# Patient Record
Sex: Male | Born: 1985 | Race: White | Hispanic: No | Marital: Married | Smoking: Never smoker
Health system: Southern US, Community
[De-identification: ages and names within clinical notes are randomized; demographics above are authoritative.]

## PROBLEM LIST (undated history)

## (undated) DIAGNOSIS — E119 Type 2 diabetes mellitus without complications: Secondary | ICD-10-CM

## (undated) DIAGNOSIS — R112 Nausea with vomiting, unspecified: Secondary | ICD-10-CM

## (undated) DIAGNOSIS — Z9889 Other specified postprocedural states: Secondary | ICD-10-CM

## (undated) DIAGNOSIS — G47419 Narcolepsy without cataplexy: Secondary | ICD-10-CM

## (undated) DIAGNOSIS — F909 Attention-deficit hyperactivity disorder, unspecified type: Secondary | ICD-10-CM

## (undated) HISTORY — PX: KNEE ARTHROSCOPY: SUR90

---

## 2005-08-04 ENCOUNTER — Encounter: Admission: RE | Admit: 2005-08-04 | Discharge: 2005-08-04 | Payer: Self-pay | Admitting: Sports Medicine

## 2010-06-14 ENCOUNTER — Emergency Department (HOSPITAL_COMMUNITY): Admission: EM | Admit: 2010-06-14 | Discharge: 2010-06-14 | Payer: Self-pay | Admitting: Emergency Medicine

## 2016-12-13 ENCOUNTER — Ambulatory Visit (INDEPENDENT_AMBULATORY_CARE_PROVIDER_SITE_OTHER): Payer: BC Managed Care – PPO | Admitting: Family Medicine

## 2016-12-13 ENCOUNTER — Encounter: Payer: Self-pay | Admitting: Family Medicine

## 2016-12-13 DIAGNOSIS — S92341A Displaced fracture of fourth metatarsal bone, right foot, initial encounter for closed fracture: Secondary | ICD-10-CM | POA: Diagnosis not present

## 2016-12-13 NOTE — Assessment & Plan Note (Signed)
icing, elevation, tylenol or motrin if needed.  Supportive shoe (offered cam walker, postop shoe but discussed not necessary except if pain in supportive shoe).  No running, cycling, swimming, lower body workouts for next 2 weeks.  Will reevaluate at that time.  Expect 6 weeks to completely heal and return to running.

## 2016-12-13 NOTE — Patient Instructions (Signed)
You have a 4th metatarsal shaft fracture. These typically take 6 weeks to heal. Ice the area 15 minutes at a time 3-4 times a day. Elevate above your heart when possible to help with swelling. Wear supportive shoe at all times when up and walking around. Cam walker, postop shoe are options for comfort only if needed. Follow up with me in 2 weeks for reevaluation. We will discuss possibility of cross training with cycling, swimming at that time. No running until 6 weeks out though.

## 2016-12-13 NOTE — Progress Notes (Signed)
PCP: Dr. Leodis SiasFrancis Wong  Subjective:   HPI: Patient is a 31 y.o. male here for right foot injury.  Patient is a runner. [place] Running 26-30 miles a week and longest run alternates between 10 and 13 miles. States on 1/27 he was at mile 8 when he stepped on something and felt/heard a pop right foot. Kept running after this - afterwards pain was bad. Associated with some swelling and bruising. Pain level 6/10, sharp dorsal to plantar right foot. No prior injuries. No skin changes, numbness.  No past medical history on file.  No current outpatient prescriptions on file prior to visit.   No current facility-administered medications on file prior to visit.     No past surgical history on file.  No Known Allergies  Social History   Social History  . Marital status: Single    Spouse name: N/A  . Number of children: N/A  . Years of education: N/A   Occupational History  . Not on file.   Social History Main Topics  . Smoking status: Never Smoker  . Smokeless tobacco: Never Used  . Alcohol use Not on file  . Drug use: Unknown  . Sexual activity: Not on file   Other Topics Concern  . Not on file   Social History Narrative  . No narrative on file    No family history on file.  BP 133/90   Pulse 69   Ht 5\' 8"  (1.727 m)   Wt 190 lb (86.2 kg)   BMI 28.89 kg/m   Review of Systems: See HPI above.     Objective:  Physical Exam:  Gen: NAD, comfortable in exam room  Right foot/ankle: Mild dorsal foot pain.  Mild bruising into 2nd-4th toes. FROM ankle without pain. TTP over mid 4th metatarsal.  No other tenderness about foot or ankle. Negative ant drawer and talar tilt.   Negative syndesmotic compression. Negative metatarsal squeeze. Thompsons test negative. NV intact distally.  Left foot/ankle: FROM without pain.   MSK u/s right foot: Minimally displaced 4th metatarsal shaft fracture noted with edema, mild  neovascularity.  No other abnormalities.  Assessment & Plan:  1. Right 4th metatarsal fracture - icing, elevation, tylenol or motrin if needed.  Supportive shoe (offered cam walker, postop shoe but discussed not necessary except if pain in supportive shoe).  No running, cycling, swimming, lower body workouts for next 2 weeks.  Will reevaluate at that time.  Expect 6 weeks to completely heal and return to running.

## 2016-12-27 ENCOUNTER — Encounter: Payer: Self-pay | Admitting: Family Medicine

## 2016-12-27 ENCOUNTER — Ambulatory Visit (INDEPENDENT_AMBULATORY_CARE_PROVIDER_SITE_OTHER): Payer: BC Managed Care – PPO | Admitting: Family Medicine

## 2016-12-27 DIAGNOSIS — S92341D Displaced fracture of fourth metatarsal bone, right foot, subsequent encounter for fracture with routine healing: Secondary | ICD-10-CM

## 2016-12-27 NOTE — Patient Instructions (Signed)
After a week as long as pain stays less than 3 on a scale of 1-10 you can start swimming (no push off the wall though), low resistance cycling. Follow up with me in 2 weeks. Icing, tylenol if needed. No running, jumping, elliptical, rowing yet.

## 2016-12-28 NOTE — Progress Notes (Signed)
PCP: Dr. Leodis SiasFrancis Wong  Subjective:   HPI: Patient is a 31 y.o. male here for right foot injury.  1/30: Patient is a runner. [place] Running 26-30 miles a week and longest run alternates between 10 and 13 miles. States on 1/27 he was at mile 8 when he stepped on something and felt/heard a pop right foot. Kept running after this - afterwards pain was bad. Associated with some swelling and bruising. Pain level 6/10, sharp dorsal to plantar right foot. No prior injuries. No skin changes, numbness.  2/13: Patient reports he's doing well. Pain level is 0/10 at rest, up to 4/10 at worst dorsal foot when changing terrain. Swelling and bruising have improved. Was worse on Thursday when on feet more. Feels more stiff in morning, improves through the day. No skin changes, numbness.  No past medical history on file.  Current Outpatient Prescriptions on File Prior to Visit  Medication Sig Dispense Refill  . amphetamine-dextroamphetamine (ADDERALL) 5 MG tablet      No current facility-administered medications on file prior to visit.     No past surgical history on file.  No Known Allergies  Social History   Social History  . Marital status: Single    Spouse name: N/A  . Number of children: N/A  . Years of education: N/A   Occupational History  . Not on file.   Social History Main Topics  . Smoking status: Never Smoker  . Smokeless tobacco: Never Used  . Alcohol use Not on file  . Drug use: Unknown  . Sexual activity: Not on file   Other Topics Concern  . Not on file   Social History Narrative  . No narrative on file    No family history on file.  BP 132/83   Pulse 82   Ht 5\' 9"  (1.753 m)   Wt 195 lb (88.5 kg)   BMI 28.80 kg/m   Review of Systems: See HPI above.     Objective:  Physical Exam:  Gen: NAD, comfortable in exam room  Right foot/ankle: No bruising, swelling. FROM ankle without pain. Mild  TTP over mid 4th metatarsal.  No other tenderness about foot or ankle. Negative ant drawer and talar tilt.   Negative syndesmotic compression. Negative metatarsal squeeze. Thompsons test negative. NV intact distally.  Left foot/ankle: FROM without pain.   MSK u/s right foot: Minimally displaced 4th metatarsal shaft fracture noted with significant neovascularity compared to last visit.  No other signs of healing.  No other abnormalities.  Assessment & Plan:  1. Right 4th metatarsal fracture - icing, elevation, tylenol or motrin if needed.  Significant neovascularity on ultrasound now and clinically healing well.  No running, jumping, elliptical yet.  Discussed parameters that he could start swimming, low resistance cycling in 1 week.  F/u in 2 weeks.

## 2016-12-28 NOTE — Assessment & Plan Note (Signed)
Right 4th metatarsal fracture - icing, elevation, tylenol or motrin if needed.  Independently performed and reviewed ultrasound - significant neovascularity on ultrasound now and clinically healing well.  No running, jumping, elliptical yet.  Discussed parameters that he could start swimming, low resistance cycling in 1 week.  F/u in 2 weeks.

## 2017-01-10 ENCOUNTER — Encounter: Payer: Self-pay | Admitting: Family Medicine

## 2017-01-10 ENCOUNTER — Ambulatory Visit (INDEPENDENT_AMBULATORY_CARE_PROVIDER_SITE_OTHER): Payer: BC Managed Care – PPO | Admitting: Family Medicine

## 2017-01-10 DIAGNOSIS — S92341D Displaced fracture of fourth metatarsal bone, right foot, subsequent encounter for fracture with routine healing: Secondary | ICD-10-CM

## 2017-01-10 NOTE — Patient Instructions (Signed)
Ok for elliptical, rowing now as well. Wait 2 weeks before starting the walk:jog program we discussed. I'd wait a week before long walking though. Follow up with me in 4 weeks or as needed. Call me with any concerns.

## 2017-01-10 NOTE — Assessment & Plan Note (Signed)
Clinically healing well and ultrasound with excellent callus formation.  Icing, elevation, tylenol or motrin if needed.  Still no return to walk:jog for 2 more weeks then can start this.  F/u in 4 weeks or prn.  Call with any concerns.

## 2017-01-10 NOTE — Progress Notes (Signed)
PCP: Dr. Leodis SiasFrancis Wong  Subjective:   HPI: Patient is a 31 y.o. male here for right foot injury.  1/30: Patient is a runner. [place] Running 26-30 miles a week and longest run alternates between 10 and 13 miles. States on 1/27 he was at mile 8 when he stepped on something and felt/heard a pop right foot. Kept running after this - afterwards pain was bad. Associated with some swelling and bruising. Pain level 6/10, sharp dorsal to plantar right foot. No prior injuries. No skin changes, numbness.  2/13: Patient reports he's doing well. Pain level is 0/10 at rest, up to 4/10 at worst dorsal foot when changing terrain. Swelling and bruising have improved. Was worse on Thursday when on feet more. Feels more stiff in morning, improves through the day. No skin changes, numbness.  2/27: Patient reports he is doing well. Has been cycling, doing some rowing without pain. Pain level 0/10. Gets some soreness first thing in the morning. Has not been running. No skin changes, numbness.  No past medical history on file.  Current Outpatient Prescriptions on File Prior to Visit  Medication Sig Dispense Refill  . amphetamine-dextroamphetamine (ADDERALL) 5 MG tablet      No current facility-administered medications on file prior to visit.     No past surgical history on file.  No Known Allergies  Social History   Social History  . Marital status: Single    Spouse name: N/A  . Number of children: N/A  . Years of education: N/A   Occupational History  . Not on file.   Social History Main Topics  . Smoking status: Never Smoker  . Smokeless tobacco: Never Used  . Alcohol use Not on file  . Drug use: Unknown  . Sexual activity: Not on file   Other Topics Concern  . Not on file   Social History Narrative  . No narrative on file    No family history on file.  BP 129/85   Pulse 68   Ht 5\' 9"  (1.753 m)   Wt 195 lb (88.5  kg)   BMI 28.80 kg/m   Review of Systems: See HPI above.     Objective:  Physical Exam:  Gen: NAD, comfortable in exam room  Right foot/ankle: No bruising, swelling. FROM ankle without pain. Minimal TTP over mid 4th metatarsal.  No other tenderness about foot or ankle. Negative metatarsal squeeze. Thompsons test negative. NV intact distally.  Left foot/ankle: FROM without pain.   MSK u/s right foot: Minimally displaced 4th metatarsal shaft fracture noted with excellent callus formation, only mild neovascularity now.  No other abnormalities.  Assessment & Plan:  1. Right 4th metatarsal fracture - Clinically healing well and ultrasound with excellent callus formation.  Icing, elevation, tylenol or motrin if needed.  Still no return to walk:jog for 2 more weeks then can start this.  F/u in 4 weeks or prn.  Call with any concerns.

## 2017-06-24 ENCOUNTER — Encounter (HOSPITAL_COMMUNITY): Payer: Self-pay

## 2017-06-24 ENCOUNTER — Emergency Department (HOSPITAL_COMMUNITY)
Admission: EM | Admit: 2017-06-24 | Discharge: 2017-06-24 | Disposition: A | Discharge status: Home / Self Care | Payer: BC Managed Care – PPO | Attending: Emergency Medicine | Admitting: Emergency Medicine

## 2017-06-24 DIAGNOSIS — E119 Type 2 diabetes mellitus without complications: Secondary | ICD-10-CM | POA: Insufficient documentation

## 2017-06-24 DIAGNOSIS — T675XXA Heat exhaustion, unspecified, initial encounter: Secondary | ICD-10-CM | POA: Diagnosis not present

## 2017-06-24 DIAGNOSIS — R55 Syncope and collapse: Secondary | ICD-10-CM | POA: Diagnosis present

## 2017-06-24 HISTORY — DX: Type 2 diabetes mellitus without complications: E11.9

## 2017-06-24 MED ORDER — ACETAMINOPHEN 500 MG PO TABS
1000.0000 mg | ORAL_TABLET | Freq: Once | ORAL | Status: AC
  Administered 2017-06-24: 1000 mg via ORAL
  Filled 2017-06-24: qty 2

## 2017-06-24 NOTE — ED Provider Notes (Addendum)
[facility]-EMERGENCY DEPT Provider Note   CSN: [id number] Arrival date & time: 06/24/17  1102     History   Chief Complaint Chief Complaint  Patient presents with  . Near Syncope    HPI Laurens Matheny is a 31 y.o. male.Patient reports he ran 16 miles today in the extreme heat. He finished running approximate 9:30 PM he noticed that he wasn't sweating is much is normal. He vomited 2 times after he stopped running. He denies any shortness of breath he complains of mild headache at present. He feels much improved since he was treated by EMS with Zofran IV and 800 mL of saline intravenously. Presently asymptomatic except for mild headache and feels much improved. He denies syncope or near syncope he did feel mildly lightheaded after completing his run. EMS obtained CBG which was 115, normal. He did have leg cramps prior to coming here which have resolved HPI  Past Medical History:  Diagnosis Date  . Diabetes mellitus without complication (HCC)   Narcolepsy Prediabetes Patient Active Problem List   Diagnosis Date Noted  . Closed displaced fracture of fourth metatarsal bone of right foot 12/13/2016    History reviewed. No pertinent surgical history.     Home Medications    Prior to Admission medications   Medication Sig Start Date End Date Taking? Authorizing Provider  amphetamine-dextroamphetamine (ADDERALL) 5 MG tablet Take 5 mg by mouth 2 (two) times daily with a meal.  11/15/16  Yes [provider]  ibuprofen (ADVIL,MOTRIN) 200 MG tablet Take 200-400 mg by mouth every 6 (six) hours as needed for headache or mild pain.   Yes [provider]  Multiple Vitamins-Minerals (MULTIVITAMIN WITH MINERALS) tablet Take 1 tablet by mouth daily.   Yes [provider]    Family History No family history on file.  Social History Social History  Substance Use Topics  . Smoking status: Never Smoker  . Smokeless tobacco: Never Used  . Alcohol use Yes      Allergies   Patient has no known allergies.   Review of Systems Review of Systems  Constitutional: Negative.   HENT: Negative.   Respiratory: Negative.   Cardiovascular: Negative.   Gastrointestinal: Positive for vomiting.  Musculoskeletal: Positive for myalgias.  Skin: Negative.   Neurological: Positive for light-headedness and headaches.  Psychiatric/Behavioral: Negative.   All other systems reviewed and are negative.    Physical Exam Updated Vital Signs BP 113/73   Pulse 75   Temp 98.1 F (36.7 C) (Oral)   Resp (!) 24   Ht 5\' 9"  (1.753 m)   Wt 86.2 kg (190 lb)   SpO2 98%   BMI 28.06 kg/m   Physical Exam  Constitutional: He appears well-developed and well-nourished. No distress.  HENT:  Head: Normocephalic and atraumatic.  Eyes: Pupils are equal, round, and reactive to light. Conjunctivae are normal.  Neck: Neck supple. No tracheal deviation present. No thyromegaly present.  Cardiovascular: Normal rate and regular rhythm.   No murmur heard. Pulmonary/Chest: Effort normal and breath sounds normal.  Abdominal: Soft. Bowel sounds are normal. He exhibits no distension. There is no tenderness.  Musculoskeletal: Normal range of motion. He exhibits no edema or tenderness.  gait normal not lightheaded on standing  Neurological: He is alert. Coordination normal.  Skin: Skin is warm and dry. No rash noted.  Psychiatric: He has a normal mood and affect.  Nursing note and vitals reviewed.    ED Treatments / Results  Labs (all labs ordered are listed,  but only abnormal results are displayed) Labs Reviewed - No data to display  EKG  EKG Interpretation None       Radiology No results found.  Procedures Procedures (including critical care time)  Medications Ordered in ED Medications - No data to display   Initial Impression / Assessment and Plan / ED Course  I have reviewed the triage vital signs and the nursing notes.  Pertinent labs & imaging  results that were available during my care of the patient were reviewed by me and considered in my medical decision making (see chart for details).     History consistent with heat illness. No further diagnostic testing needed. Plan rest, cold fluids. Return as needed or see PMD  Final Clinical Impressions(s) / ED Diagnoses  Heat Exhaustion Final diagnoses:  None    New Prescriptions New Prescriptions   No medications on file     Doug SouJacubowitz, Thelma Viana, MD 06/24/17 1158    Doug SouJacubowitz, Gracyn Allor, MD 06/24/17 1158

## 2017-06-24 NOTE — ED Notes (Signed)
ED Provider at bedside. 

## 2017-06-24 NOTE — ED Triage Notes (Signed)
Pt brought in by EMS due to having a near syncopal; episode while running. Pt has 2-3 episodes of vomiting and dizziness. Pt received 800cc of NS and 4mg  of zofran. Pt a&ox4.

## 2017-12-05 ENCOUNTER — Encounter: Payer: Self-pay | Admitting: Family Medicine

## 2017-12-05 ENCOUNTER — Ambulatory Visit: Payer: BC Managed Care – PPO | Admitting: Family Medicine

## 2017-12-05 DIAGNOSIS — M25562 Pain in left knee: Secondary | ICD-10-CM

## 2017-12-05 DIAGNOSIS — M25561 Pain in right knee: Secondary | ICD-10-CM | POA: Insufficient documentation

## 2017-12-05 NOTE — Patient Instructions (Signed)
You have a medial meniscus tear. Take ibuprofen 600mg  three times a day with food for 7-10 days then as needed. Icing 15 minutes at a time 3-4 times a day. Straight leg raises, knee extensions, hamstring curls, hamstring swings 3 sets of 10 once a day. Add ankle weight if these are too easy. Avoid running for 4 weeks and cross train with cycling, swimming. Then try running in preparation for your follow up with me. Follow up with me in 6 weeks. Consider injection, MRI if not improving as expected.

## 2017-12-05 NOTE — Progress Notes (Signed)
PCP: Ileana LaddWong, Francis P, MD  Subjective:   HPI: Patient is a 32 y.o. male here for left knee pain.  Patient denies known injury or trauma. He reports about 1 month ago he developed medial left knee pain with running just before christmas. Had swelling. Has not run since then. Pain is more of an annoyance at 3/10 level. Has been cycling - feels tight in the beginning but loosens up. No prior injuries to this knee. No skin changes, numbness.  Past Medical History:  Diagnosis Date  . Diabetes mellitus without complication [facility](HCC)     Current Outpatient Medications on File Prior to Visit  Medication Sig Dispense Refill  . amphetamine-dextroamphetamine (ADDERALL) 5 MG tablet Take 5 mg by mouth 2 (two) times daily with a meal.     . ibuprofen (ADVIL,MOTRIN) 200 MG tablet Take 200-400 mg by mouth every 6 (six) hours as needed for headache or mild pain.    . Multiple Vitamins-Minerals (MULTIVITAMIN WITH MINERALS) tablet Take 1 tablet by mouth daily.     No current facility-administered medications on file prior to visit.     History reviewed. No pertinent surgical history.  No Known Allergies  Social History   Socioeconomic History  . Marital status: Single    Spouse name: Not on file  . Number of children: Not on file  . Years of education: Not on file  . Highest education level: Not on file  Social Needs  . Financial resource strain: Not on file  . Food insecurity - worry: Not on file  . Food insecurity - inability: Not on file  . Transportation needs - medical: Not on file  . Transportation needs - non-medical: Not on file  Occupational History  . Not on file  Tobacco Use  . Smoking status: Never Smoker  . Smokeless tobacco: Never Used  Substance and Sexual Activity  . Alcohol use: Yes  . Drug use: No  . Sexual activity: Not on file  Other Topics Concern  . Not on file  Social History Narrative  . Not on file    History reviewed. No pertinent family history.  BP  118/77   Pulse 66   Ht 5\' 5"  (1.651 m)   Wt 195 lb (88.5 kg)   BMI 32.45 kg/m   Review of Systems: See HPI above.     Objective:  Physical Exam:  Gen: NAD, comfortable in exam room  Left knee: No gross deformity, ecchymoses, effusion. TTP medial joint line.  No other tenderness. FROM with 5/5 strength. Negative ant/post drawers. Negative valgus/varus testing. Negative lachmanns. Positive mcmurrays, apleys, thessalys.  Negative sit home, patellar apprehension. NV intact distally.  Right knee: No gross deformity, ecchymoses, swelling. No TTP. FROM with 5/5 strength. Negative ant/post drawers. Negative valgus/varus testing. Negative mcmurrays, apleys, patellar apprehension. NV intact distally.   MSK u/s left knee:  Linear tear noted anterior portion of medial meniscus - no vascularity to this.  Assessment & Plan:  1. Left knee pain - 2/2 medial meniscus tear.  No locking.  Will trial conservative treatment.  Ibuprofen, icing, reviewed home exercises to do daily.  Avoid squats, lunges, running (for another 4 weeks while focusing on rehab).  Declined PT for now.  Consider this, injection, MRI if not improving over next 6 weeks.

## 2017-12-05 NOTE — Assessment & Plan Note (Signed)
2/2 medial meniscus tear.  No locking.  Will trial conservative treatment.  Ibuprofen, icing, reviewed home exercises to do daily.  Avoid squats, lunges, running (for another 4 weeks while focusing on rehab).  Declined PT for now.  Consider this, injection, MRI if not improving over next 6 weeks.

## 2018-01-19 ENCOUNTER — Ambulatory Visit: Payer: BC Managed Care – PPO | Admitting: Family Medicine

## 2018-01-22 ENCOUNTER — Ambulatory Visit: Payer: BC Managed Care – PPO | Admitting: Family Medicine

## 2018-01-29 ENCOUNTER — Ambulatory Visit: Payer: BC Managed Care – PPO | Admitting: Family Medicine

## 2018-01-29 ENCOUNTER — Encounter: Payer: Self-pay | Admitting: Family Medicine

## 2018-01-29 DIAGNOSIS — M25562 Pain in left knee: Secondary | ICD-10-CM

## 2018-01-29 NOTE — Patient Instructions (Signed)
We will go ahead with an MRI of your knee. We will check to see if this requires x-rays before ordering them for you - if it does we'll put in the x-rays and you [place] in [place] at your convenience (I expect them to be normal). Ok for all activities as tolerated - I'd avoid squats, lunges, leg pressing as you have been. Continue home exercises - this usually helps make the recovery after surgery faster if you keep your quad and hamstrings strong.

## 2018-01-30 ENCOUNTER — Encounter: Payer: Self-pay | Admitting: Family Medicine

## 2018-01-30 NOTE — Addendum Note (Signed)
Addended by: Kathi SimpersWISE, Alicea Wente F on: 01/30/2018 12:10 PM   Modules accepted: Orders

## 2018-01-30 NOTE — Assessment & Plan Note (Signed)
2/2 medial meniscus tear.  Unfortunately he has not improved with conservative treatment to date over 2 months.  His ultrasound suggested medial meniscus tear as well.  Advised to go ahead with an MRI to confirm.  Okay for activities as tolerated but advised to avoid squats, lunges, leg press.  Continue with home exercise program.  Ibuprofen, Tylenol only if needed.

## 2018-01-30 NOTE — Progress Notes (Signed)
PCP: Ileana LaddWong, Francis P, MD  Subjective:   HPI: Patient is a 32 y.o. male here for left knee pain.  1/22: Patient denies known injury or trauma. He reports about 1 month ago he developed medial left knee pain with running just before christmas. Had swelling. Has not run since then. Pain is more of an annoyance at 3/10 level. Has been cycling - feels tight in the beginning but loosens up. No prior injuries to this knee. No skin changes, numbness.  3/18: Patient reports he has been diligently doing his home exercise program. However he only reports very mild improvement if at all. He still unable to run without pain. Pain is especially worse when trying to run uphill. Pain at rest 0 out of 10 but about 3 out of 10 with walking and a soreness medially.  Pain can be anteriorly as well when he is walking. No swelling. He has been icing, taking Advil. No skin changes, numbness. Past Medical History:  Diagnosis Date  . Diabetes mellitus without complication [facility](HCC)     Current Outpatient Medications on File Prior to Visit  Medication Sig Dispense Refill  . amphetamine-dextroamphetamine (ADDERALL) 5 MG tablet Take 5 mg by mouth 2 (two) times daily with a meal.     . ibuprofen (ADVIL,MOTRIN) 200 MG tablet Take 200-400 mg by mouth every 6 (six) hours as needed for headache or mild pain.    . Multiple Vitamins-Minerals (MULTIVITAMIN WITH MINERALS) tablet Take 1 tablet by mouth daily.     No current facility-administered medications on file prior to visit.     History reviewed. No pertinent surgical history.  No Known Allergies  Social History   Socioeconomic History  . Marital status: Single    Spouse name: Not on file  . Number of children: Not on file  . Years of education: Not on file  . Highest education level: Not on file  Social Needs  . Financial resource strain: Not on file  . Food insecurity - worry: Not on file  . Food insecurity - inability: Not on file  .  Transportation needs - medical: Not on file  . Transportation needs - non-medical: Not on file  Occupational History  . Not on file  Tobacco Use  . Smoking status: Never Smoker  . Smokeless tobacco: Never Used  Substance and Sexual Activity  . Alcohol use: Yes  . Drug use: No  . Sexual activity: Not on file  Other Topics Concern  . Not on file  Social History Narrative  . Not on file    History reviewed. No pertinent family history.  BP 119/78   Pulse 70   Ht 5\' 9"  (1.753 m)   Wt 195 lb (88.5 kg)   BMI 28.80 kg/m   Review of Systems: See HPI above.     Objective:  Physical Exam:  Gen: NAD, comfortable in exam room.  Left knee: No gross deformity, ecchymoses, swelling. TTP medial joint line. FROM with 5/5 strength flexion and extension. Negative ant/post drawers. Negative valgus/varus testing. Negative lachmanns. Positive mcmurrays, apleys, thessalys, sit home.  Negative patellar apprehension. NV intact distally.  Assessment & Plan:  1. Left knee pain - 2/2 medial meniscus tear.  Unfortunately he has not improved with conservative treatment to date over 2 months.  His ultrasound suggested medial meniscus tear as well.  Advised to go ahead with an MRI to confirm.  Okay for activities as tolerated but advised to avoid squats, lunges, leg press.  Continue with  home exercise program.  Ibuprofen, Tylenol only if needed.

## 2018-02-04 ENCOUNTER — Ambulatory Visit
Admission: RE | Admit: 2018-02-04 | Discharge: 2018-02-04 | Disposition: A | Discharge status: Home / Self Care | Payer: BC Managed Care – PPO | Source: Ambulatory Visit | Attending: Family Medicine | Admitting: Family Medicine

## 2018-02-04 DIAGNOSIS — M25562 Pain in left knee: Secondary | ICD-10-CM

## 2018-02-06 NOTE — Addendum Note (Signed)
Addended by: Kathi SimpersWISE, Pricilla Moehle F on: 02/06/2018 10:24 AM   Modules accepted: Orders

## 2018-06-26 ENCOUNTER — Ambulatory Visit: Payer: BC Managed Care – PPO | Admitting: Family Medicine

## 2018-06-26 ENCOUNTER — Encounter: Payer: Self-pay | Admitting: Family Medicine

## 2018-06-26 VITALS — BP 122/84 | HR 76 | Ht 69.0 in | Wt 195.0 lb

## 2018-06-26 DIAGNOSIS — M25561 Pain in right knee: Secondary | ICD-10-CM | POA: Diagnosis not present

## 2018-06-26 NOTE — Patient Instructions (Signed)
You have a medial meniscus tear of your right knee. Ibuprofen or aleve if needed. Icing 15 minutes at a time 3-4 times a day if needed. Straight leg raises, knee extensions, hamstring curls, hamstring swings 3 sets of 10 once a day. Add ankle weight if these are too easy. Cut out the interval running day.  Cross train if needed with cycling and swimming. Let me know if you need anything - I'd try to get you through the race without a change in your gait, surgery but if this worsens, locks (unlikely), let me know.

## 2018-06-26 NOTE — Progress Notes (Signed)
PCP: Ileana LaddWong, Francis P, MD  Subjective:   HPI: Patient is a 32 y.o. male here for right knee pain.  Patient was previously seen for pain in left knee, diagnosed with medial meniscus tear and had arthroscopy. States current pain feels similar. Started about 1.5 months ago without injury. Pain is medial, sharp with episodes of sharper pain. Pain is 1/10 currently. Worse with sleeping, driving, stairs. Has been icing. Will radiate up to hip. Pain bad for first mile of running then resolves. No injury. No skin changes, numbness, swelling.  Past Medical History:  Diagnosis Date  . Diabetes mellitus without complication [facility](HCC)     Current Outpatient Medications on File Prior to Visit  Medication Sig Dispense Refill  . amphetamine-dextroamphetamine (ADDERALL) 20 MG tablet Take 10 mg by mouth 2 (two) times daily.  0  . ibuprofen (ADVIL,MOTRIN) 200 MG tablet Take 200-400 mg by mouth every 6 (six) hours as needed for headache or mild pain.    . Multiple Vitamins-Minerals (MULTIVITAMIN WITH MINERALS) tablet Take 1 tablet by mouth daily.     No current facility-administered medications on file prior to visit.     History reviewed. No pertinent surgical history.  No Known Allergies  Social History   Socioeconomic History  . Marital status: Single    Spouse name: Not on file  . Number of children: Not on file  . Years of education: Not on file  . Highest education level: Not on file  Occupational History  . Not on file  Social Needs  . Financial resource strain: Not on file  . Food insecurity:    Worry: Not on file    Inability: Not on file  . Transportation needs:    Medical: Not on file    Non-medical: Not on file  Tobacco Use  . Smoking status: Never Smoker  . Smokeless tobacco: Never Used  Substance and Sexual Activity  . Alcohol use: Yes  . Drug use: No  . Sexual activity: Not on file  Lifestyle  . Physical activity:    Days per week: Not on file    Minutes per  session: Not on file  . Stress: Not on file  Relationships  . Social connections:    Talks on phone: Not on file    Gets together: Not on file    Attends religious service: Not on file    Active member of club or organization: Not on file    Attends meetings of clubs or organizations: Not on file    Relationship status: Not on file  . Intimate partner violence:    Fear of current or ex partner: Not on file    Emotionally abused: Not on file    Physically abused: Not on file    Forced sexual activity: Not on file  Other Topics Concern  . Not on file  Social History Narrative  . Not on file    History reviewed. No pertinent family history.  BP 122/84   Pulse 76   Ht 5\' 9"  (1.753 m)   Wt 195 lb (88.5 kg)   BMI 28.80 kg/m   Review of Systems: See HPI above.     Objective:  Physical Exam:  Gen: NAD, comfortable in exam room  Right knee: No gross deformity, ecchymoses, swelling. TTP medial joint line.  No other tenderness. FROM with 5/5 strength. Negative ant/post drawers. Negative valgus/varus testing. Negative lachmanns. Positive mcmurrays, apleys, thessalys.  Negative patellar apprehension. NV intact distally.  Left knee: No  deformity.  Well healed arthroscopy scars. FROM with 5/5 strength. No tenderness to palpation. NVI distally.   Assessment & Plan:  1. Right knee pain - 2/2 medial meniscus tear.  Running marathon in 2 months.  Will try to treat conservatively.  Icing, aleve or ibuprofen.  Shown home exercises to do daily, will do those learned from physical therapy previously for this knee.  Discussed cross training in addition to running, cut out his interval day since worse with this part of his training.  F/u prn.  Call us if not able to tolerate conservative treatment.

## 2018-11-16 ENCOUNTER — Ambulatory Visit (HOSPITAL_COMMUNITY)
Admission: RE | Admit: 2018-11-16 | Discharge: 2018-11-16 | Disposition: A | Discharge status: Home / Self Care | Payer: BC Managed Care – PPO | Source: Ambulatory Visit | Attending: Orthopedic Surgery | Admitting: Orthopedic Surgery

## 2018-11-16 ENCOUNTER — Other Ambulatory Visit (HOSPITAL_COMMUNITY): Payer: Self-pay | Admitting: Orthopedic Surgery

## 2018-11-16 DIAGNOSIS — I82451 Acute embolism and thrombosis of right peroneal vein: Secondary | ICD-10-CM | POA: Insufficient documentation

## 2018-11-16 DIAGNOSIS — M7989 Other specified soft tissue disorders: Secondary | ICD-10-CM | POA: Insufficient documentation

## 2018-11-16 DIAGNOSIS — M79604 Pain in right leg: Secondary | ICD-10-CM

## 2018-11-16 DIAGNOSIS — I82441 Acute embolism and thrombosis of right tibial vein: Secondary | ICD-10-CM | POA: Diagnosis not present

## 2018-11-16 NOTE — Progress Notes (Signed)
RLE venous duplex       has been completed. Preliminary results can be found under CV proc through chart review. Jeb Levering, BS, RDMS, RVT    Called results to [place], [place]. Will be sending in new Rx for patient to pick up today. Confirmed with patient.

## 2019-06-26 ENCOUNTER — Telehealth: Payer: Self-pay

## 2019-06-26 NOTE — Telephone Encounter (Signed)
Called and scheduled patient

## 2019-08-01 ENCOUNTER — Other Ambulatory Visit: Payer: Self-pay

## 2019-08-01 ENCOUNTER — Ambulatory Visit: Payer: BC Managed Care – PPO | Admitting: Family Medicine

## 2019-08-01 DIAGNOSIS — M21371 Foot drop, right foot: Secondary | ICD-10-CM | POA: Diagnosis not present

## 2019-08-01 MED ORDER — GABAPENTIN 100 MG PO CAPS: 200.0000 mg | ORAL_CAPSULE | Freq: Every day | ORAL | 3 refills | Status: DC

## 2019-08-01 MED ORDER — VITAMIN D (ERGOCALCIFEROL) 1.25 MG (50000 UNIT) PO CAPS: 50000.0000 [IU] | ORAL_CAPSULE | ORAL | 0 refills | Status: DC

## 2019-08-01 NOTE — Patient Instructions (Signed)
Good to see you.  Exercises 3 times a week.  Gabapentin 200 mg at night  Once weekly vitamin D for 12 weeks.  [place] carbon x Heel lift in toe of shoe B complex  See me again in 5-6 weeks

## 2019-08-01 NOTE — Progress Notes (Signed)
[facility] [place], [place] [address] Phone: [phone] Subjective:   Jorge Lee, am serving as a scribe for Dr. Hulan Saas.   CC: Difficulty with running  [id number]  Jorge Lee is a 33 y.o. male coming in with complaint of right knee pain. Had surgery on December 26th. Was seen at [employer] as he is a runner. Had left menisecotmy in 2019. Also has meniscus repair and LCL repair on the right in December 2019. He is trying to figure out if he needs to change his shoes or strengthen the muscles. Patient is still running. Mentions that he wears down the lateral aspect of his shoes with running. Since his surgeries he has been wearing out the lateral aspect more than right. Currently runs in [facility]. Denies any foot pain. Uses Tylenol prn for his knee pain. 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       Past Medical History:  Diagnosis Date  . Diabetes mellitus without complication ([facility])    Lee past surgical history on file. Social History   Socioeconomic History  . Marital status: Single    Spouse name: Not on file  . Number of children: Not on file  . Years of education: Not on file  . Highest education level: Not on file  Occupational History  . Not on file  Social Needs  . Financial resource strain: Not on file  . Food insecurity    Worry: Not on file    Inability: Not on file  . Transportation needs    Medical: Not on file    Non-medical: Not on file  Tobacco Use  . Smoking status: Never Smoker  . Smokeless tobacco: Never Used  Substance and Sexual Activity  . Alcohol use: Yes  . Drug use: Lee  . Sexual activity: Not on file  Lifestyle  . Physical activity    Days per week: Not on file    Minutes per session: Not on file  . Stress: Not on file  Relationships  . Social Herbalist on phone: Not on file    Gets together: Not on file    Attends religious  service: Not on file    Active member of club or organization: Not on file    Attends meetings of clubs or organizations: Not on file    Relationship status: Not on file  Other Topics Concern  . Not on file  Social History Narrative  . Not on file   Lee Known Allergies Lee family history on file.     Current Outpatient Medications (Analgesics):  .  ibuprofen (ADVIL,MOTRIN) 200 MG tablet, Take 200-400 mg by mouth every 6 (six) hours as needed for headache or mild pain.   Current Outpatient Medications (Other):  .  amphetamine-dextroamphetamine (ADDERALL) 20 MG tablet, Take 10 mg by mouth 2 (two) times daily. .  Multiple Vitamins-Minerals (MULTIVITAMIN WITH MINERALS) tablet, Take 1 tablet by mouth daily. Marland Kitchen  gabapentin (NEURONTIN) 100 MG capsule, Take 2 capsules (200 mg total) by mouth at bedtime. .  Vitamin D, Ergocalciferol, (DRISDOL) 1.25 MG (50000 UT) CAPS capsule, Take 1 capsule (50,000 Units total) by mouth every 7 (seven) days.    Past medical history, social, surgical and family history all reviewed in electronic medical record.  Lee pertanent information unless stated regarding to the chief complaint.   Review of Systems:  Lee headache, visual changes, nausea, vomiting,  diarrhea, constipation, dizziness, abdominal pain, skin rash, fevers, chills, night sweats, weight loss, swollen lymph nodes, body aches, joint swelling, muscle aches, chest pain, shortness of breath, mood changes.   Objective  Blood pressure 110/62, pulse 68, height 5\' 9"  (1.753 m), weight 207 lb (93.9 kg), SpO2 98 %.    General: Lee apparent distress alert and oriented x3 mood and affect normal, dressed appropriately.  HEENT: Pupils equal, extraocular movements intact  Respiratory: Patient's speak in full sentences and does not appear short of breath  Cardiovascular: Lee lower extremity edema, non tender, Lee erythema  Skin: Warm dry intact with Lee signs of infection or rash on extremities or on axial  skeleton.  Abdomen: Soft nontender  Neuro: Cranial nerves II through XII are intact, neurovascularly intact in all extremities with 2+ DTRs and 2+ pulses.  Lymph: Lee lymphadenopathy of posterior or anterior cervical chain or axillae bilaterally.  Gait normal with good balance and coordination.  MSK:  Non tender with full range of motion and good stability and symmetric strength and tone of shoulders, elbows, wrist, hip, knee bilaterally.  Patient's right knee still has some mild instability on the lateral aspect of the knee.  Patient does have full range of motion.  Mild tenderness over the lateral joint line.  Patient's holds right ankle in a supinated position.  4-5 strength with dorsiflexion.  Patient does have full passive range of motion.    Impression and Recommendations:     This case required medical decision making of moderate complexity. The above documentation has been reviewed and is accurate and complete Judi SaaZachary M Smith, DO       Note: This dictation was prepared with Dragon dictation along with smaller phrase technology. Any transcriptional errors that result from this process are unintentional.

## 2019-08-02 ENCOUNTER — Encounter: Payer: Self-pay | Admitting: Family Medicine

## 2019-08-02 DIAGNOSIS — M21371 Foot drop, right foot: Secondary | ICD-10-CM | POA: Insufficient documentation

## 2019-08-02 NOTE — Assessment & Plan Note (Signed)
Patient does have more of a foot drop and some weakness noted.  There is concerned that there is an injury to the fibular nerve since patient's LCL rupture as well as surgery.  I also believe the patient's OA stability brace is unfortunately causing undue pressure of the fibular nerve.  We will attempt to change patient's brace.  In addition to this we discussed a heel lift in the toe to try to help with the foot drop initially.  Gabapentin to help with the nerve health, patient on ultrasound also still seems to have some mild degenerative changes of the lateral compartment we will monitor this.  Once patient has more strength of the dorsiflexion of the foot and will consider more intense strengthening and then work on gait

## 2019-09-04 ENCOUNTER — Encounter: Payer: Self-pay | Admitting: Family Medicine

## 2019-09-04 ENCOUNTER — Ambulatory Visit: Payer: Self-pay

## 2019-09-04 ENCOUNTER — Other Ambulatory Visit: Payer: BC Managed Care – PPO

## 2019-09-04 ENCOUNTER — Ambulatory Visit: Payer: BC Managed Care – PPO | Admitting: Family Medicine

## 2019-09-04 ENCOUNTER — Other Ambulatory Visit: Payer: Self-pay

## 2019-09-04 VITALS — BP 128/84 | HR 69 | Ht 69.0 in | Wt 207.0 lb

## 2019-09-04 DIAGNOSIS — M25561 Pain in right knee: Secondary | ICD-10-CM | POA: Diagnosis not present

## 2019-09-04 DIAGNOSIS — M21371 Foot drop, right foot: Secondary | ICD-10-CM

## 2019-09-04 DIAGNOSIS — M2550 Pain in unspecified joint: Secondary | ICD-10-CM | POA: Diagnosis not present

## 2019-09-04 DIAGNOSIS — M2500 Hemarthrosis, unspecified joint: Secondary | ICD-10-CM | POA: Diagnosis not present

## 2019-09-04 DIAGNOSIS — G8929 Other chronic pain: Secondary | ICD-10-CM

## 2019-09-04 NOTE — Assessment & Plan Note (Signed)
Patient is not making any improvement and likely injury to the fibular nerve.  We will continue to monitor.  Continue vitamin supplementation may need an AFO

## 2019-09-04 NOTE — Progress Notes (Signed)
[name] [address] [place] [place], [place] [address] Phone: [phone] Subjective:   Jorge Lee, am serving as a scribe for Dr. Antoine Primas.  I'm seeing this patient by the request  of:    CC: Right knee pain follow-up  [id number]    08/01/19: Patient does have more of a foot drop and some weakness noted.  There is concerned that there is an injury to the fibular nerve since patient's LCL rupture as well as surgery.  I also believe the patient's OA stability brace is unfortunately causing undue pressure of the fibular nerve.  We will attempt to change patient's brace.  In addition to this we discussed a heel lift in the toe to try to help with the foot drop initially.  Gabapentin to help with the nerve health, patient on ultrasound also still seems to have some mild degenerative changes of the lateral compartment we will monitor this.  Once patient has more strength of the dorsiflexion of the foot and will consider more intense strengthening and then work on gait  Update- 09/04/19 Jorge Lee is a 33 y.o. male coming in with complaint of right foot drop. Patient states that he is about the same as last visit. Has stabbing pain at his fibular head with sitting. Tried using brace with new pad. Has not changed his activity. Is able to run 10 miles over the weekend. Does note that he has had a Baker's Cyst that he feels is causing pressure in back of knee.   Past Medical History:  Diagnosis Date  . Diabetes mellitus without complication (HCC)    No past surgical history on file. Social History   Socioeconomic History  . Marital status: Single    Spouse name: Not on file  . Number of children: Not on file  . Years of education: Not on file  . Highest education level: Not on file  Occupational History  . Not on file  Social Needs  . Financial resource strain: Not on file  . Food insecurity    Worry: Not on file    Inability: Not on file   . Transportation needs    Medical: Not on file    Non-medical: Not on file  Tobacco Use  . Smoking status: Never Smoker  . Smokeless tobacco: Never Used  Substance and Sexual Activity  . Alcohol use: Yes  . Drug use: No  . Sexual activity: Not on file  Lifestyle  . Physical activity    Days per week: Not on file    Minutes per session: Not on file  . Stress: Not on file  Relationships  . Social Musician on phone: Not on file    Gets together: Not on file    Attends religious service: Not on file    Active member of club or organization: Not on file    Attends meetings of clubs or organizations: Not on file    Relationship status: Not on file  Other Topics Concern  . Not on file  Social History Narrative  . Not on file   No Known Allergies No family history on file.     Current Outpatient Medications (Analgesics):  .  ibuprofen (ADVIL,MOTRIN) 200 MG tablet, Take 200-400 mg by mouth every 6 (six) hours as needed for headache or mild pain.   Current Outpatient Medications (Other):  .  amphetamine-dextroamphetamine (ADDERALL) 20 MG tablet, Take 10 mg by mouth 2 (two) times  daily. .  gabapentin (NEURONTIN) 100 MG capsule, Take 2 capsules (200 mg total) by mouth at bedtime. .  Multiple Vitamins-Minerals (MULTIVITAMIN WITH MINERALS) tablet, Take 1 tablet by mouth daily. .  Vitamin D, Ergocalciferol, (DRISDOL) 1.25 MG (50000 UT) CAPS capsule, Take 1 capsule (50,000 Units total) by mouth every 7 (seven) days.    Past medical history, social, surgical and family history all reviewed in electronic medical record.  No pertanent information unless stated regarding to the chief complaint.   Review of Systems:  No headache, visual changes, nausea, vomiting, diarrhea, constipation, dizziness, abdominal pain, skin rash, fevers, chills, night sweats, weight loss, swollen lymph nodes, body aches, joint swelling, muscle aches, chest pain, shortness of breath, mood changes.    Objective  Blood pressure 128/84, pulse 69, height 5\' 9"  (1.753 m), weight 207 lb (93.9 kg), SpO2 99 %.    General: No apparent distress alert and oriented x3 mood and affect normal, dressed appropriately.  HEENT: Pupils equal, extraocular movements intact  Respiratory: Patient's speak in full sentences and does not appear short of breath  Cardiovascular: No lower extremity edema, non tender, no erythema  Skin: Warm dry intact with no signs of infection or rash on extremities or on axial skeleton.  Abdomen: Soft nontender  Neuro: Cranial nerves II through XII are intact, neurovascularly intact in all extremities with 2+ DTRs and 2+ pulses.  Lymph: No lymphadenopathy of posterior or anterior cervical chain or axillae bilaterally.  Gait normal with good balance and coordination.  MSK:  Non tender with full range of motion and good stability and symmetric strength and tone of shoulders, elbows, wrist, hip, and bilaterally.  Knee: Right knee Varus deformity of the knee noted During mild instability noted of the lateral aspect of the knee but endpoint is felt.  Lacks last 10 degrees of flexion which is worse than previous exam. Fullness noted in the popliteal area Non painful patellar compression. Patellar glide without crepitus. Patellar and quadriceps tendons unremarkable. Hamstring and quadriceps strength is normal.  Ankle: No visible erythema or swelling. Patient still ambulates with a mild foot drop.  4-5 strength in dorsi flexion noted on the right compared to the left.   Procedure: Real-time Ultrasound Guided Injection of right knee cyst Device: GE Logiq Q7 Ultrasound guided injection is preferred based studies that show increased duration, increased effect, greater accuracy, decreased procedural pain, increased response rate, and decreased cost with ultrasound guided versus blind injection.  Verbal informed consent obtained.  Time-out conducted.  Noted no overlying erythema,  induration, or other signs of local infection.  Skin prepped in a sterile fashion.  Local anesthesia: Topical Ethyl chloride.  With sterile technique and under real time ultrasound guidance: With a 22-gauge 2 inch needle patient was injected with 4 cc of 0.5% Marcaine and aspirated 30 cc of serosanguineous fluid then injected 1 cc of Kenalog 40 mg/dL. This was from a posterior Completed without difficulty  Pain immediately resolved suggesting accurate placement of the medication.  Advised to call if fevers/chills, erythema, induration, drainage, or persistent bleeding.  Images permanently stored and available for review in the ultrasound unit.  Impression: Technically successful ultrasound guided injection.   Impression and Recommendations:     This case required medical decision making of moderate complexity. The above documentation has been reviewed and is accurate and complete Lyndal Pulley, DO       Note: This dictation was prepared with Dragon dictation along with smaller phrase technology. Any transcriptional errors that  result from this process are unintentional.

## 2019-09-04 NOTE — Patient Instructions (Signed)
Write me Monday Follow up in 3 weeks if doing well If worsening will need MRI

## 2019-09-04 NOTE — Assessment & Plan Note (Signed)
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  Discussed which activities to do which wants to avoid.  Patient is to increase activity slowly over the course the neck several weeks.  Patient does not make any improvement and continues to have the foot drop I would like patient to have a MRI of the knee again.  Patient is in agreement with the plan and will call us if it reaccumulated over the weekend otherwise we will see patient again in 3 weeks and ultrasound at that time.  Spent  25 minutes with patient face-to-face and had greater than 50% of counseling including as described above in assessment and plan.

## 2019-09-05 LAB — SYNOVIAL CELL COUNT + DIFF, W/ CRYSTALS
Basophils, %: 0 %
Eosinophils-Synovial: 0 % (ref 0–2)
Lymphocytes-Synovial Fld: 50 % (ref 0–74)
Monocyte/Macrophage: 33 % (ref 0–69)
Neutrophil, Synovial: 0 % (ref 0–24)
Synoviocytes, %: 17 % — ABNORMAL HIGH (ref 0–15)
WBC, Synovial: 575 cells/uL — ABNORMAL HIGH (ref <150)

## 2019-09-09 ENCOUNTER — Encounter: Payer: Self-pay | Admitting: Family Medicine

## 2019-09-23 ENCOUNTER — Encounter: Payer: Self-pay | Admitting: Family Medicine

## 2019-09-27 ENCOUNTER — Ambulatory Visit: Payer: BC Managed Care – PPO | Admitting: Family Medicine

## 2019-09-29 NOTE — Progress Notes (Signed)
[name] [address] [place], [place] [address] Phone: [phone] Subjective:   Jorge Lee, am serving as a scribe for Dr. Antoine Primas.    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  Discussed which activities to do which wants to avoid.  Patient is to increase activity slowly over the course the neck several weeks.  Patient does not make any improvement and continues to have the foot drop I would like patient to have a MRI of the knee again.  Patient is in agreement with the plan and will call us if it reaccumulated over the weekend otherwise we will see patient again in 3 weeks and ultrasound at that time.  Spent  25 minutes with patient face-to-face and had greater than 50% of counseling including as described above in assessment and plan.   Update 09/30/2019 Jorge Lee is a 33 y.o. male coming in with complaint of right knee pain. Patient states that when he had his knee drained he felt amazing. One week ago patient said cyst filled back up and his knee is feeling worse again. States that knee feels like it is getting stuck. Continues to workout. Wore carbon x shoes today for his run.  Patient states that overall continues to be fairly active but feels that the reaccumulation has occurred and feels like he is back to the baseline that he had been.  Patient has been doing the exercises and taking the vitamins with minimal improvement.    Past Medical History:  Diagnosis Date  . Diabetes mellitus without complication (HCC)    No past surgical history on file. Social History   Socioeconomic History  . Marital status: Single    Spouse name: Not on file  . Number of children: Not on file  . Years of education: Not on file  . Highest education level: Not on file  Occupational History  . Not on file   Social Needs  . Financial resource strain: Not on file  . Food insecurity    Worry: Not on file    Inability: Not on file  . Transportation needs    Medical: Not on file    Non-medical: Not on file  Tobacco Use  . Smoking status: Never Smoker  . Smokeless tobacco: Never Used  Substance and Sexual Activity  . Alcohol use: Yes  . Drug use: No  . Sexual activity: Not on file  Lifestyle  . Physical activity    Days per week: Not on file    Minutes per session: Not on file  . Stress: Not on file  Relationships  . Social Musician on phone: Not on file    Gets together: Not on file    Attends religious service: Not on file    Active member of club or organization: Not on file    Attends meetings of clubs or organizations: Not on file    Relationship status: Not on file  Other Topics Concern  . Not on file  Social History Narrative  . Not on file   No Known Allergies No family history on file.     Current Outpatient Medications (Analgesics):  .  ibuprofen (ADVIL,MOTRIN) 200 MG tablet, Take 200-400 mg by mouth every 6 (six) hours as needed for headache or mild pain.   Current  Outpatient Medications (Other):  .  amphetamine-dextroamphetamine (ADDERALL) 20 MG tablet, Take 10 mg by mouth 2 (two) times daily. Marland Kitchen  gabapentin (NEURONTIN) 100 MG capsule, Take 2 capsules (200 mg total) by mouth at bedtime. .  Multiple Vitamins-Minerals (MULTIVITAMIN WITH MINERALS) tablet, Take 1 tablet by mouth daily. .  Vitamin D, Ergocalciferol, (DRISDOL) 1.25 MG (50000 UT) CAPS capsule, Take 1 capsule (50,000 Units total) by mouth every 7 (seven) days.    Past medical history, social, surgical and family history all reviewed in electronic medical record.  No pertanent information unless stated regarding to the chief complaint.   Review of Systems:  No headache, visual changes, nausea, vomiting, diarrhea, constipation, dizziness, abdominal pain, skin rash, fevers, chills, night  sweats, weight loss, swollen lymph nodes, body aches, joint swelling, muscle aches, chest pain, shortness of breath, mood changes.   Objective  There were no vitals taken for this visit. Systems examined below as of    General: No apparent distress alert and oriented x3 mood and affect normal, dressed appropriately.  HEENT: Pupils equal, extraocular movements intact  Respiratory: Patient's speak in full sentences and does not appear short of breath  Cardiovascular: No lower extremity edema, non tender, no erythema  Skin: Warm dry intact with no signs of infection or rash on extremities or on axial skeleton.  Abdomen: Soft nontender  Neuro: Cranial nerves II through XII are intact, neurovascularly intact in all extremities with 2+ DTRs and 2+ pulses.  Lymph: No lymphadenopathy of posterior or anterior cervical chain or axillae bilaterally.  Gait normal with good balance and coordination.  MSK:  Non tender with full range of motion and good stability and symmetric strength and tone of shoulders, elbows, wrist, hip and ankles bilaterally.  Mild foot drop still noted on the right side Knee: Right Normal to inspection with no erythema or effusion or obvious bony abnormalities. Palpation fullness noted in the popliteal area again. ROM full in flexion and extension and lower leg rotation. Ligaments with solid consistent endpoints including ACL, PCL, LCL, MCL. Negative Mcmurray's, Apley's, and Thessalonian tests. Non painful patellar compression. Patellar glide with mild crepitus. Patellar and quadriceps tendons unremarkable. Hamstring and quadriceps strength is normal. Contralateral knee unremarkable  Procedure: Real-time Ultrasound Guided Injection of right knee Device: GE Logiq Q7 Ultrasound guided injection is preferred based studies that show increased duration, increased effect, greater accuracy, decreased procedural pain, increased response rate, and decreased cost with ultrasound guided  versus blind injection.  Verbal informed consent obtained.  Time-out conducted.  Noted no overlying erythema, induration, or other signs of local infection.  Skin prepped in a sterile fashion.  Local anesthesia: Topical Ethyl chloride.  With sterile technique and under real time ultrasound guidance: With a 22-gauge 2 inch needle patient was injected with 4 cc of 0.5% Marcaine and aspirated 40 cc of straw-colored fluid then injected 1 cc of Kenalog 40 mg/dL. This was from a posterior approach Completed without difficulty  Pain immediately resolved suggesting accurate placement of the medication.  Advised to call if fevers/chills, erythema, induration, drainage, or persistent bleeding.  Images permanently stored and available for review in the ultrasound unit.  Impression: Technically successful ultrasound guided injection.   Impression and Recommendations:     This case required medical decision making of moderate complexity. The above documentation has been reviewed and is accurate and complete Lyndal Pulley, DO       Note: This dictation was prepared with Dragon dictation along with smaller phrase technology. Any  transcriptional errors that result from this process are unintentional.

## 2019-09-30 ENCOUNTER — Other Ambulatory Visit: Payer: Self-pay

## 2019-09-30 ENCOUNTER — Encounter: Payer: Self-pay | Admitting: Family Medicine

## 2019-09-30 ENCOUNTER — Ambulatory Visit: Payer: BC Managed Care – PPO | Admitting: Family Medicine

## 2019-09-30 ENCOUNTER — Ambulatory Visit: Payer: Self-pay

## 2019-09-30 VITALS — BP 130/82 | HR 66 | Ht 69.0 in | Wt 203.0 lb

## 2019-09-30 DIAGNOSIS — G8929 Other chronic pain: Secondary | ICD-10-CM

## 2019-09-30 DIAGNOSIS — M25561 Pain in right knee: Secondary | ICD-10-CM | POA: Diagnosis not present

## 2019-09-30 DIAGNOSIS — M21371 Foot drop, right foot: Secondary | ICD-10-CM

## 2019-09-30 NOTE — Assessment & Plan Note (Signed)
Patient had aspiration done.  Tolerated the procedure well.  Patient did not have any bloody tinge at this moment which is an improvement.  Discussed with patient that I do think that this could be helpful.  We will see but if it reaccumulated again in 2 to 4 weeks I would want advanced imaging with would be an MRI.

## 2019-10-09 ENCOUNTER — Encounter: Payer: Self-pay | Admitting: Family Medicine

## 2019-10-09 DIAGNOSIS — M25561 Pain in right knee: Secondary | ICD-10-CM

## 2019-10-14 ENCOUNTER — Encounter: Payer: Self-pay | Admitting: Family Medicine

## 2019-10-23 ENCOUNTER — Ambulatory Visit
Admission: RE | Admit: 2019-10-23 | Discharge: 2019-10-23 | Disposition: A | Discharge status: Home / Self Care | Payer: BC Managed Care – PPO | Source: Ambulatory Visit | Attending: Family Medicine | Admitting: Family Medicine

## 2019-10-23 ENCOUNTER — Other Ambulatory Visit: Payer: Self-pay

## 2019-10-23 DIAGNOSIS — M25561 Pain in right knee: Secondary | ICD-10-CM

## 2019-10-25 ENCOUNTER — Encounter: Payer: Self-pay | Admitting: Family Medicine

## 2019-10-28 ENCOUNTER — Telehealth: Payer: Self-pay

## 2019-10-28 NOTE — Telephone Encounter (Signed)
Called to schedule patient virtual appointment. Left message to call back.

## 2019-10-28 NOTE — Telephone Encounter (Signed)
Patient scheduled for virtual appointment Thursday!

## 2019-10-31 ENCOUNTER — Ambulatory Visit (INDEPENDENT_AMBULATORY_CARE_PROVIDER_SITE_OTHER): Payer: BC Managed Care – PPO | Admitting: Family Medicine

## 2019-10-31 ENCOUNTER — Encounter: Payer: Self-pay | Admitting: Family Medicine

## 2019-10-31 DIAGNOSIS — M2500 Hemarthrosis, unspecified joint: Secondary | ICD-10-CM | POA: Diagnosis not present

## 2019-10-31 DIAGNOSIS — M25561 Pain in right knee: Secondary | ICD-10-CM

## 2019-10-31 MED ORDER — VITAMIN D (ERGOCALCIFEROL) 1.25 MG (50000 UNIT) PO CAPS: 50000.0000 [IU] | ORAL_CAPSULE | ORAL | 0 refills | Status: DC

## 2019-10-31 NOTE — Progress Notes (Signed)
Virtual Visit via Video Note  I connected with Jorge Lee on 10/31/19 at 11:30 AM EST by a video enabled telemedicine application and verified that I am speaking with the correct person using two identifiers.  Location: Patient: Patient in home setting Provider: In office setting   I discussed the limitations of evaluation and management by telemedicine and the availability of in person appointments. The patient expressed understanding and agreed to proceed.  History of Present Illness: 33 year old gentleman who was having right-sided knee pain.  Status post LCL ligament repair who continued to have some mild foot drop and continued to have recurrent swelling of a Baker's cyst that was more of a hemarthrosis.  MRI was ordered.  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  Found to have a large Baker's cyst as well.  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  Patient has elected to try conservative therapy and we discussed the possibility of PRP.  Patient wants to avoid surgical intervention at this time.  Discussed other alternatives and waiting steroid injections discussed conservative management and decreasing high impact exercises.   Follow Up Instructions: After patient decides what treatment option he would like to do.    I discussed the assessment and treatment plan with the patient. The patient was provided an opportunity to ask questions and all were answered. The patient agreed with the plan and demonstrated an understanding of the instructions.   The patient was advised to call back or seek an in-person evaluation if the symptoms worsen or if the condition fails to improve as anticipated.  I provided 26 minutes of face-to-face time during this encounter.   Lyndal Pulley, DO

## 2019-11-01 ENCOUNTER — Encounter: Payer: Self-pay | Admitting: Family Medicine

## 2019-11-12 ENCOUNTER — Ambulatory Visit (INDEPENDENT_AMBULATORY_CARE_PROVIDER_SITE_OTHER): Payer: BC Managed Care – PPO

## 2019-11-12 ENCOUNTER — Other Ambulatory Visit: Payer: Self-pay

## 2019-11-12 ENCOUNTER — Ambulatory Visit: Payer: Self-pay | Admitting: Family Medicine

## 2019-11-12 ENCOUNTER — Encounter: Payer: Self-pay | Admitting: Family Medicine

## 2019-11-12 VITALS — BP 110/76 | Ht 69.0 in | Wt 199.0 lb

## 2019-11-12 DIAGNOSIS — G8929 Other chronic pain: Secondary | ICD-10-CM

## 2019-11-12 DIAGNOSIS — M2500 Hemarthrosis, unspecified joint: Secondary | ICD-10-CM

## 2019-11-12 DIAGNOSIS — M25561 Pain in right knee: Secondary | ICD-10-CM | POA: Diagnosis not present

## 2019-11-12 NOTE — Assessment & Plan Note (Signed)
PRP was done.  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  Patient is to do the post PRP increase in activity slowly over the course the next several weeks.  We discussed that sensation went over with athletic trainer.  Patient will follow-up again in 3 weeks for further evaluation.  Due to the size of the Baker's cyst may need to consider the possibility of aspiration at that time.

## 2019-11-12 NOTE — Progress Notes (Signed)
Terrilee Files D.O.  Subjective:   I, Jorge Lee, am serving as a scribe for Dr. Antoine Primas. This visit occurred during the SARS-CoV-2 public health emergency.  Safety protocols were in place, including screening questions prior to the visit, additional usage of staff PPE, and extensive cleaning of exam room while observing appropriate contact time as indicated for disinfecting solutions.   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  Patient has elected to try conservative therapy and we discussed the possibility of PRP.  Patient wants to avoid surgical intervention at this time.  Discussed other alternatives and waiting steroid injections discussed conservative management and decreasing high impact exercises.  Update 11/12/2019 Jorge Lee is a 33 y.o. male coming in with complaint of right knee pain. Patient states that his knee is tight over posterior aspect of knee on lateral side. Is having hamstring tightness in proximal hamstring which occurs with driving. Has continued to run.  MRI of the knee that was independently visualized by me shows the patient did have a 5 x 7 mm full-thickness cartilage defect involving the medial femoral condyle and a significantly large Baker's cyst.    Past Medical History:  Diagnosis Date  . Diabetes mellitus without complication (HCC)    No past surgical history on file. Social History   Socioeconomic History  . Marital status: Single    Spouse name: Not on file  . Number of children: Not on file  . Years of education: Not on file  . Highest education level: Not on file  Occupational History  . Not on file  Tobacco Use  . Smoking status: Never Smoker  . Smokeless tobacco: Never Used  Substance and Sexual Activity  . Alcohol use: Yes  . Drug use: No  . Sexual activity: Not on file  Other Topics Concern  .  Not on file  Social History Narrative  . Not on file   Social Determinants of Health   Financial Resource Strain:   . Difficulty of Paying Living Expenses: Not on file  Food Insecurity:   . Worried About Programme researcher, broadcasting/film/video in the Last Year: Not on file  . Ran Out of Food in the Last Year: Not on file  Transportation Needs:   . Lack of Transportation (Medical): Not on file  . Lack of Transportation (Non-Medical): Not on file  Physical Activity:   . Days of Exercise per Week: Not on file  . Minutes of Exercise per Session: Not on file  Stress:   . Feeling of Stress : Not on file  Social Connections:   . Frequency of Communication with Friends and Family: Not on file  . Frequency of Social Gatherings with Friends and Family: Not on file  . Attends Religious Services: Not on file  . Active Member of Clubs or Organizations: Not on file  . Attends Banker Meetings: Not on file  . Marital Status: Not on file   No Known Allergies No family history on file.     Current Outpatient Medications (Analgesics):  .  ibuprofen (ADVIL,MOTRIN) 200 MG tablet, Take 200-400 mg by mouth every 6 (six) hours as needed for headache or mild pain.   Current Outpatient Medications (Other):  .  amphetamine-dextroamphetamine (ADDERALL) 20 MG tablet, Take 10 mg by mouth 2 (two) times daily. Marland Kitchen  gabapentin (NEURONTIN) 100  MG capsule, Take 2 capsules (200 mg total) by mouth at bedtime. .  Multiple Vitamins-Minerals (MULTIVITAMIN WITH MINERALS) tablet, Take 1 tablet by mouth daily. .  Vitamin D, Ergocalciferol, (DRISDOL) 1.25 MG (50000 UT) CAPS capsule, Take 1 capsule (50,000 Units total) by mouth every 7 (seven) days.    Past medical history, social, surgical and family history all reviewed in electronic medical record.  No pertanent information unless stated regarding to the chief complaint.   Review of Systems:  No headache, visual changes, nausea, vomiting, diarrhea, constipation,  dizziness, abdominal pain, skin rash, fevers, chills, night sweats, weight loss, swollen lymph nodes, body aches, joint swelling, muscle aches, chest pain, shortness of breath, mood changes.   Objective  There were no vitals taken for this visit. Systems examined below as of    General: No apparent distress alert and oriented x3 mood and affect normal, dressed appropriately.  HEENT: Pupils equal, extraocular movements intact  Respiratory: Patient's speak in full sentences and does not appear short of breath  Cardiovascular: No lower extremity edema, non tender, no erythema  Skin: Warm dry intact with no signs of infection or rash on extremities or on axial skeleton.  Abdomen: Soft nontender  Neuro: Cranial nerves II through XII are intact, neurovascularly intact in all extremities with 2+ DTRs and 2+ pulses.  Lymph: No lymphadenopathy of posterior or anterior cervical chain or axillae bilaterally.  Gait normal with good balance and coordination.  MSK:  Non tender with full range of motion and good stability and symmetric strength and tone of shoulders, elbows, wrist, hip and ankles bilaterally.  Right knee exam still has significant instability noted.  Tenderness to palpation over the medial and lateral joint space.  Patient pain in the popliteal area with significant fullness noted as well.  Procedure: Real-time Ultrasound Guided Injection of after informed written and verbal consent, patient was seated on exam table. Right knee was prepped with alcohol swab and utilizing anterolateral approach, patient's right knee space was injected with 4:1  marcaine 0.5%: Kenalog 40mg /dL. Patient tolerated the procedure well without immediate complications. Device: GE Logiq Q7 Ultrasound guided injection is preferred based studies that show increased duration, increased effect, greater accuracy, decreased procedural pain, increased response rate, and decreased cost with ultrasound guided versus blind  injection.  Verbal informed consent obtained.  Time-out conducted.  Noted no overlying erythema, induration, or other signs of local infection.  Skin prepped in a sterile fashion.  Local anesthesia: Topical Ethyl chloride.  With sterile technique and under real time ultrasound guidance: With a 21-gauge 2 inch needle injected with 2 cc of 0.5% Marcaine and aspirated 25 cc of straw-colored fluid.  Then injected with 5 cc of precentrifuge PRP Completed without difficulty  Pain immediately resolved suggesting accurate placement of the medication.  Advised to call if fevers/chills, erythema, induration, drainage, or persistent bleeding.  Images permanently stored and available for review in the ultrasound unit.  Impression: Technically successful ultrasound guided injection.    Impression and Recommendations:     This case required medical decision making of moderate complexity. The above documentation has been reviewed and is accurate and complete Lyndal Pulley, DO       Note: This dictation was prepared with Dragon dictation along with smaller phrase technology. Any transcriptional errors that result from this process are unintentional.

## 2019-11-12 NOTE — Patient Instructions (Signed)
See me again in 3 weeks 

## 2019-12-04 ENCOUNTER — Other Ambulatory Visit: Payer: Self-pay

## 2019-12-04 ENCOUNTER — Ambulatory Visit: Payer: BC Managed Care – PPO | Admitting: Family Medicine

## 2019-12-04 ENCOUNTER — Encounter: Payer: Self-pay | Admitting: Family Medicine

## 2019-12-04 DIAGNOSIS — M25561 Pain in right knee: Secondary | ICD-10-CM

## 2019-12-04 NOTE — Patient Instructions (Signed)
Start running progression 1 min walk 1 jog walk 30 min 3x a week 2 min walk 2 min jog 30 min 3x a week  Compression sleeve with exercises Ice no matter what See me again 6 weeks

## 2019-12-04 NOTE — Progress Notes (Signed)
[facility] [address] [place] [address] Phone: [phone] Subjective:   Jorge Lee, am serving as a scribe for Dr. Antoine Primas. This visit occurred during the SARS-CoV-2 public health emergency.  Safety protocols were in place, including screening questions prior to the visit, additional usage of staff PPE, and extensive cleaning of exam room while observing appropriate contact time as indicated for disinfecting solutions.   I'm seeing this patient by the request  of:  Ileana Ladd, MD  CC: Knee pain follow-up  OFB:[id number]   11/12/2019 PRP was done.  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  Patient is to do the post PRP increase in activity slowly over the course the next several weeks.  We discussed that sensation went over with athletic trainer.  Patient will follow-up again in 3 weeks for further evaluation.  Due to the size of the Baker's cyst may need to consider the possibility of aspiration at that time.  Update 12/04/2019 Jorge Lee is a 34 y.o. male coming in with complaint of right knee pain. Patient had PRP last visit. Has not felt a notable difference. Has not ran since last visit.  Patient states that has not had any significant pain.  Feels like is making some progress overall.  States may be some daily activities that is improved     Past Medical History:  Diagnosis Date  . Diabetes mellitus without complication (HCC)    No past surgical history on file. Social History   Socioeconomic History  . Marital status: Single    Spouse name: Not on file  . Number of children: Not on file  . Years of education: Not on file  . Highest education level: Not on file  Occupational History  . Not on file  Tobacco Use  . Smoking status: Never Smoker  . Smokeless tobacco: Never Used  Substance and Sexual Activity  . Alcohol use: Yes  . Drug use: No  . Sexual activity: Not  on file  Other Topics Concern  . Not on file  Social History Narrative  . Not on file   Social Determinants of Health   Financial Resource Strain:   . Difficulty of Paying Living Expenses: Not on file  Food Insecurity:   . Worried About Programme researcher, broadcasting/film/video in the Last Year: Not on file  . Ran Out of Food in the Last Year: Not on file  Transportation Needs:   . Lack of Transportation (Medical): Not on file  . Lack of Transportation (Non-Medical): Not on file  Physical Activity:   . Days of Exercise per Week: Not on file  . Minutes of Exercise per Session: Not on file  Stress:   . Feeling of Stress : Not on file  Social Connections:   . Frequency of Communication with Friends and Family: Not on file  . Frequency of Social Gatherings with Friends and Family: Not on file  . Attends Religious Services: Not on file  . Active Member of Clubs or Organizations: Not on file  . Attends Banker Meetings: Not on file  . Marital Status: Not on file   No Known Allergies No family history on file.     Current Outpatient Medications (Analgesics):  .  ibuprofen (ADVIL,MOTRIN) 200 MG tablet, Take 200-400 mg by mouth every 6 (six) hours as needed for headache or mild pain.   Current Outpatient Medications (Other):  .  amphetamine-dextroamphetamine (ADDERALL) 20 MG tablet, Take 10 mg by mouth 2 (two) times daily. Marland Kitchen  gabapentin (NEURONTIN) 100 MG capsule, Take 2 capsules (200 mg total) by mouth at bedtime. .  Multiple Vitamins-Minerals (MULTIVITAMIN WITH MINERALS) tablet, Take 1 tablet by mouth daily. .  Vitamin D, Ergocalciferol, (DRISDOL) 1.25 MG (50000 UT) CAPS capsule, Take 1 capsule (50,000 Units total) by mouth every 7 (seven) days.    Past medical history, social, surgical and family history all reviewed in electronic medical record.  No pertanent information unless stated regarding to the chief complaint.   Review of Systems:  No headache, visual changes, nausea,  vomiting, diarrhea, constipation, dizziness, abdominal pain, skin rash, fevers, chills, night sweats, weight loss, swollen lymph nodes, body aches, joint swelling, chest pain, shortness of breath, mood changes. POSITIVE muscle aches  Objective  Blood pressure 110/80, pulse 96, height 5\' 9"  (1.753 m), weight 203 lb (92.1 kg), SpO2 97 %.   General: No apparent distress alert and oriented x3 mood and affect normal, dressed appropriately.  HEENT: Pupils equal, extraocular movements intact  Respiratory: Patient's speak in full sentences and does not appear short of breath  Cardiovascular: No lower extremity edema, non tender, no erythema  Skin: Warm dry intact with no signs of infection or rash on extremities or on axial skeleton.  Abdomen: Soft nontender  Neuro: Cranial nerves II through XII are intact, neurovascularly intact in all extremities with 2+ DTRs and 2+ pulses.  Lymph: No lymphadenopathy of posterior or anterior cervical chain or axillae bilaterally.  Gait normal with good balance and coordination.  MSK:  Non tender with full range of motion and good stability and symmetric strength and tone of shoulders, elbows, wrist, hip, and ankles bilaterally.  Right knee exam does have a still fullness in the popliteal area.  Patient does have good stability though of the LCL compared to previously.  Patient has maybe minorly decreased tenderness in the medial joint line.    Impression and Recommendations:      The above documentation has been reviewed and is accurate and complete Lyndal Pulley, DO       Note: This dictation was prepared with Dragon dictation along with smaller phrase technology. Any transcriptional errors that result from this process are unintentional.

## 2019-12-04 NOTE — Assessment & Plan Note (Signed)
Posterior knee pain with hemarthrosis as well as a Baker's cyst.  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  Hopefully this will be beneficial.  Increase activity slowly in the next 3 weeks.  Will come back and see me again in 6 weeks.  May need aspiration of the Baker's cyst

## 2020-01-09 ENCOUNTER — Other Ambulatory Visit: Payer: Self-pay

## 2020-01-09 ENCOUNTER — Ambulatory Visit: Payer: BC Managed Care – PPO | Attending: Family

## 2020-01-09 DIAGNOSIS — Z23 Encounter for immunization: Secondary | ICD-10-CM | POA: Insufficient documentation

## 2020-01-09 NOTE — Progress Notes (Signed)
   [facility]ov[facility] Vaccination [facility]  Name:  Jorge Lee    MRN: [id number] DOB: 12-26-1985  01/09/2020  Mr. Heinlen was observed post Covid-19 immunization for 15 minutes without incidence. He was provided with Vaccine Information Sheet and instruction to access the V-Safe system.   Mr. Brill was instructed to call 911 with any severe reactions post vaccine: Marland Kitchen Difficulty breathing  . Swelling of your face and throat  . A fast heartbeat  . A bad rash all over your body  . Dizziness and weakness    Immunizations Administered    Name Date Dose VIS Date Route   Moderna COVID-19 Vaccine 01/09/2020  3:30 PM 0.5 mL 10/15/2019 Intramuscular   Manufacturer: Moderna   Lot: [id number]   NDC: 93570-177-93

## 2020-01-15 ENCOUNTER — Ambulatory Visit: Payer: BC Managed Care – PPO | Admitting: Family Medicine

## 2020-01-15 ENCOUNTER — Other Ambulatory Visit: Payer: Self-pay

## 2020-01-15 ENCOUNTER — Ambulatory Visit (INDEPENDENT_AMBULATORY_CARE_PROVIDER_SITE_OTHER): Payer: BC Managed Care – PPO

## 2020-01-15 ENCOUNTER — Encounter: Payer: Self-pay | Admitting: Family Medicine

## 2020-01-15 VITALS — BP 126/86 | HR 83 | Ht 69.0 in | Wt 206.0 lb

## 2020-01-15 DIAGNOSIS — M21371 Foot drop, right foot: Secondary | ICD-10-CM

## 2020-01-15 DIAGNOSIS — M25561 Pain in right knee: Secondary | ICD-10-CM

## 2020-01-15 DIAGNOSIS — G8929 Other chronic pain: Secondary | ICD-10-CM | POA: Diagnosis not present

## 2020-01-15 DIAGNOSIS — M93261 Osteochondritis dissecans, right knee: Secondary | ICD-10-CM | POA: Insufficient documentation

## 2020-01-15 NOTE — Assessment & Plan Note (Signed)
Patient continues to have the increase in the Baker's cyst again.  No longer hemarthrosis which is improvement.  Unfortunately though patient cannot increase activity without the increasing swelling.  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  Patient is in agreement with the plan.  Follow-up again

## 2020-01-15 NOTE — Assessment & Plan Note (Signed)
Improvement noted since patient's surgery and is doing the physical therapy

## 2020-01-15 NOTE — Patient Instructions (Addendum)
Dr. Myrna [facility] Any questions please don't hesitate

## 2020-01-15 NOTE — Progress Notes (Signed)
[name] [address] [place] [address] Phone: [phone] Subjective:   Jorge Lee, am serving as a scribe for Dr. Antoine Primas. This visit occurred during the SARS-CoV-2 public health emergency.  Safety protocols were in place, including screening questions prior to the visit, additional usage of staff PPE, and extensive cleaning of exam room while observing appropriate contact time as indicated for disinfecting solutions.  I'm seeing this patient by the request  of:  Ileana Ladd, MD  CC: Right knee exam  [id number]   12/04/2019 Posterior knee pain with hemarthrosis as well as a Baker's cyst.  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  Hopefully this will be beneficial.  Increase activity slowly in the next 3 weeks.  Will come back and see me again in 6 weeks.  May need aspiration of the Baker's cyst  Update 01/15/2020 Jorge Lee is a 34 y.o. male coming in with complaint of right knee pain. Patient states that he is having a tight feeling from the middle of the hamstring into the right glute. Wonders if this is sciatic nerve pain. Notes that the cyst in posterior knee is swollen. Patient notes a quick sharp pain in the knee intermittently.   Does not feel that the PRP made any significant difference at the moment.  MRI has shown that patient did have an OCD noted.   Past Medical History:  Diagnosis Date  . Diabetes mellitus without complication ([facility])    No past surgical history on file. Social History   Socioeconomic History  . Marital status: Single    Spouse name: Not on file  . Number of children: Not on file  . Years of education: Not on file  . Highest education level: Not on file  Occupational History  . Not on file  Tobacco Use  . Smoking status: Never Smoker  . Smokeless tobacco: Never Used  Substance and Sexual Activity  . Alcohol use: Yes  . Drug use: No  . Sexual activity: Not  on file  Other Topics Concern  . Not on file  Social History Narrative  . Not on file   Social Determinants of Health   Financial Resource Strain:   . Difficulty of Paying Living Expenses: Not on file  Food Insecurity:   . Worried About Programme researcher, broadcasting/film/video in the Last Year: Not on file  . Ran Out of Food in the Last Year: Not on file  Transportation Needs:   . Lack of Transportation (Medical): Not on file  . Lack of Transportation (Non-Medical): Not on file  Physical Activity:   . Days of Exercise per Week: Not on file  . Minutes of Exercise per Session: Not on file  Stress:   . Feeling of Stress : Not on file  Social Connections:   . Frequency of Communication with Friends and Family: Not on file  . Frequency of Social Gatherings with Friends and Family: Not on file  . Attends Religious Services: Not on file  . Active Member of Clubs or Organizations: Not on file  . Attends Banker Meetings: Not on file  . Marital Status: Not on file   No Known Allergies no known drug allergies No family history on file.  No family history of autoimmune     Current Outpatient Medications (Analgesics):  .  ibuprofen (ADVIL,MOTRIN) 200 MG tablet, Take 200-400 mg by mouth every 6 (six) hours  as needed for headache or mild pain.   Current Outpatient Medications (Other):  .  amphetamine-dextroamphetamine (ADDERALL) 20 MG tablet, Take 10 mg by mouth 2 (two) times daily. Marland Kitchen  gabapentin (NEURONTIN) 100 MG capsule, Take 2 capsules (200 mg total) by mouth at bedtime. .  Multiple Vitamins-Minerals (MULTIVITAMIN WITH MINERALS) tablet, Take 1 tablet by mouth daily. .  Vitamin D, Ergocalciferol, (DRISDOL) 1.25 MG (50000 UT) CAPS capsule, Take 1 capsule (50,000 Units total) by mouth every 7 (seven) days.   Reviewed prior external information including notes and imaging from  primary care provider As well as notes that were available from care everywhere and other healthcare  systems.  Past medical history, social, surgical and family history all reviewed in electronic medical record.  No pertanent information unless stated regarding to the chief complaint.   Review of Systems:  No headache, visual changes, nausea, vomiting, diarrhea, constipation, dizziness, abdominal pain, skin rash, fevers, chills, night sweats, weight loss, swollen lymph nodes, body aches, joint swelling, chest pain, shortness of breath, mood changes. POSITIVE muscle aches  Objective  Blood pressure 126/86, pulse 83, height 5\' 9"  (1.753 m), weight 206 lb (93.4 kg), SpO2 98 %.   General: No apparent distress alert and oriented x3 mood and affect normal, dressed appropriately.  HEENT: Pupils equal, extraocular movements intact  Respiratory: Patient's speak in full sentences and does not appear short of breath  Cardiovascular: No lower extremity edema, non tender, no erythema  Skin: Warm dry intact with no signs of infection or rash on extremities or on axial skeleton.  Abdomen: Soft nontender  Neuro: Cranial nerves II through XII are intact, neurovascularly intact in all extremities with 2+ DTRs and 2+ pulses.  Lymph: No lymphadenopathy of posterior or anterior cervical chain or axillae bilaterally.  Gait normal with good balance and coordination.  MSK: Right knee exam shows the patient does have significant fullness in the popliteal area.  Good range of motion though of the knee noted.  Patient is minimally tender over the medial and lateral joint line.  Procedure: Real-time Ultrasound Guided Injection of right knee Device: GE Logiq Q7 Ultrasound guided injection is preferred based studies that show increased duration, increased effect, greater accuracy, decreased procedural pain, increased response rate, and decreased cost with ultrasound guided versus blind injection.  Verbal informed consent obtained.  Time-out conducted.  Noted no overlying erythema, induration, or other signs of local  infection.  Skin prepped in a sterile fashion.  Local anesthesia: Topical Ethyl chloride.  With sterile technique and under real time ultrasound guidance: With a 22-gauge 2 inch needle patient was injected with 4 cc of 0.5% Marcaine and aspirated 40 cc of straw colored fluid and injected 1 cc of Kenalog 40 mg/dL. This was from a posterior approach.  Completed without difficulty  Pain immediately resolved suggesting accurate placement of the medication.  Advised to call if fevers/chills, erythema, induration, drainage, or persistent bleeding.  Images permanently stored and available for review in the ultrasound unit.  Impression: Technically successful ultrasound guided injection.   Impression and Recommendations:     This case required medical decision making of moderate complexity. The above documentation has been reviewed and is accurate and complete Lyndal Pulley, DO       Note: This dictation was prepared with Dragon dictation along with smaller phrase technology. Any transcriptional errors that result from this process are unintentional.

## 2020-01-17 ENCOUNTER — Other Ambulatory Visit: Payer: Self-pay | Admitting: Family Medicine

## 2020-01-20 ENCOUNTER — Encounter: Payer: Self-pay | Admitting: Family Medicine

## 2020-01-29 ENCOUNTER — Encounter: Payer: Self-pay | Admitting: Family Medicine

## 2020-01-29 ENCOUNTER — Other Ambulatory Visit: Payer: Self-pay

## 2020-01-29 MED ORDER — VITAMIN D (ERGOCALCIFEROL) 1.25 MG (50000 UNIT) PO CAPS: 50000.0000 [IU] | ORAL_CAPSULE | ORAL | 0 refills | Status: DC

## 2020-02-02 IMAGING — MR MR KNEE*R* W/O CM
5 of 7 series · 22 of 40 positions shown · non-contrast
Comparison: X-ray 06/14/2010, MRI 09/24/2018

CLINICAL DATA: Posterior right knee pain. History of prior surgery

EXAM:
MRI OF THE RIGHT KNEE WITHOUT CONTRAST
TECHNIQUE: Multiplanar, multisequence MR imaging of the knee was performed. No
intravenous contrast was administered.

[Series 3: T2 fat-sat · axial · 4.0mm · 0.50mm/px · z∈[-108,+37]mm · 6 of 30 slices shown (1 of 2)]
[im 1/30]
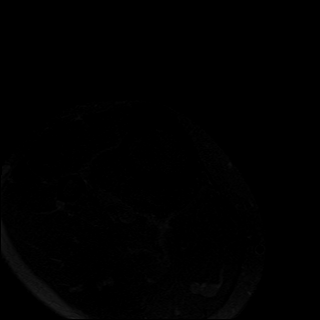
[im 6/30]
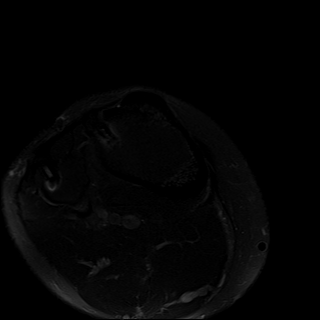
[im 12/30]
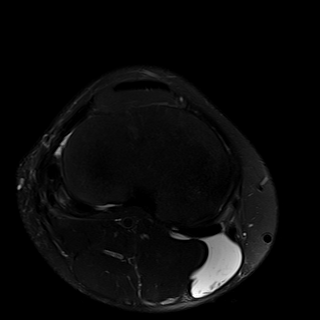
[im 18/30]
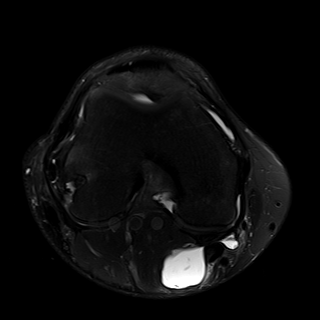
[im 24/30]
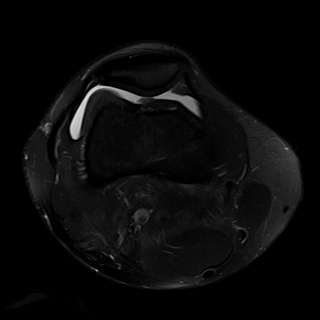
[im 30/30]
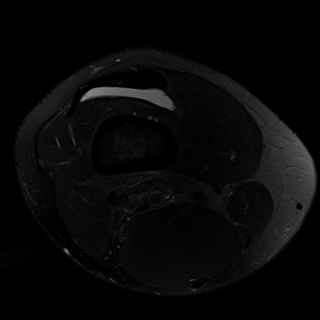

[Series 5: PD fat-sat · coronal · 4.0mm · 0.29mm/px · 6 of 30 slices shown (1 of 3)]
[im 1/30]
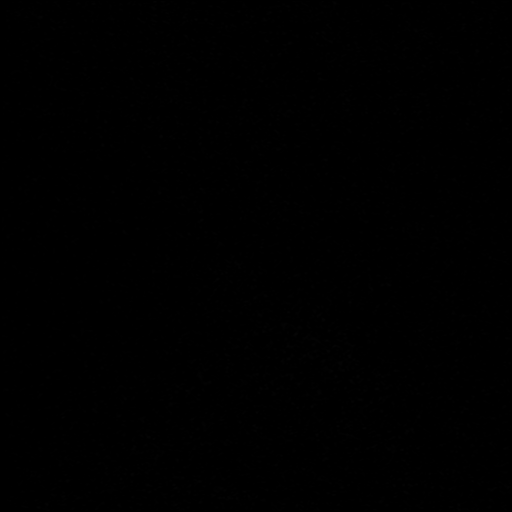
[im 6/30]
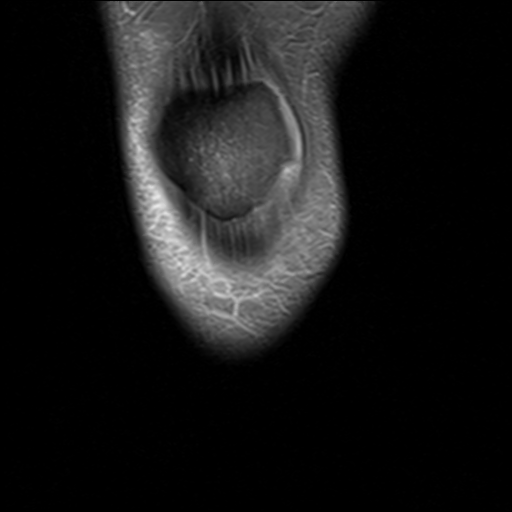
[im 12/30]
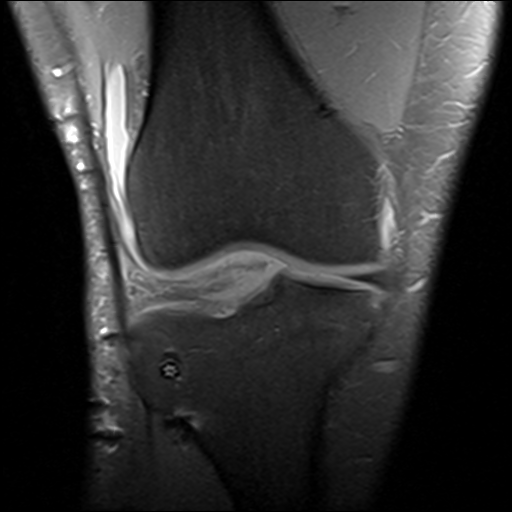
[im 18/30]
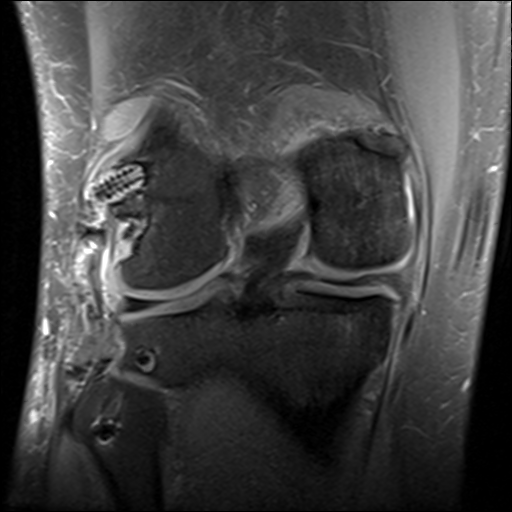
[im 24/30]
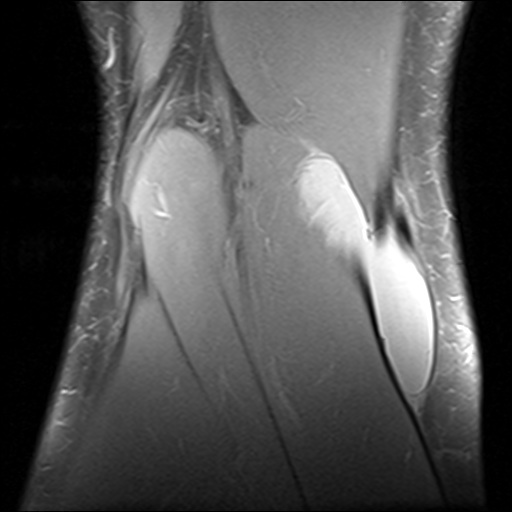
[im 30/30]
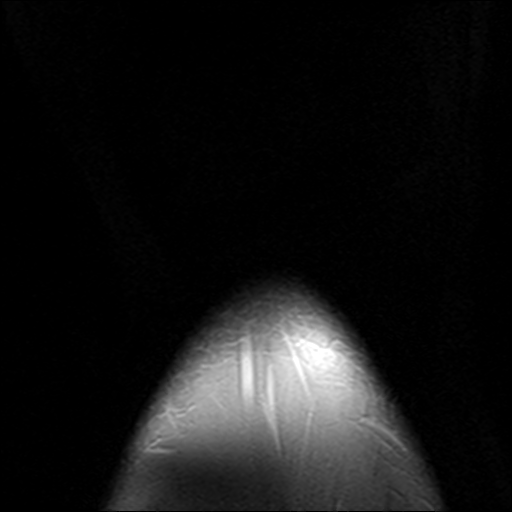

[Series 6: T2 fat-sat · coronal · 4.0mm · 0.29mm/px · 1 of 30 slices shown (2 of 2)]
[im 1/30]
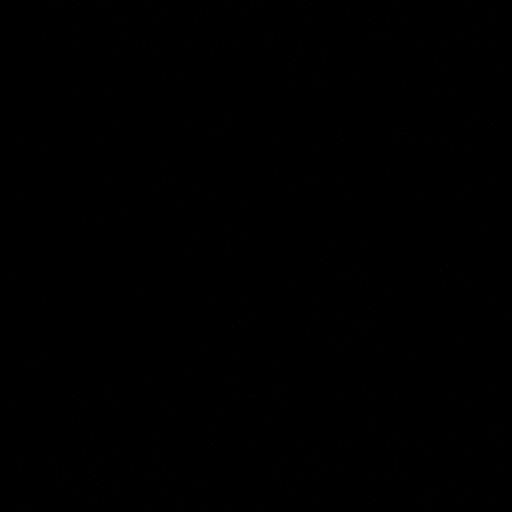

[Series 8: PD fat-sat · sagittal · 3.0mm · 0.29mm/px · 7 of 32 slices shown (2 of 3)]
[im 1/32]
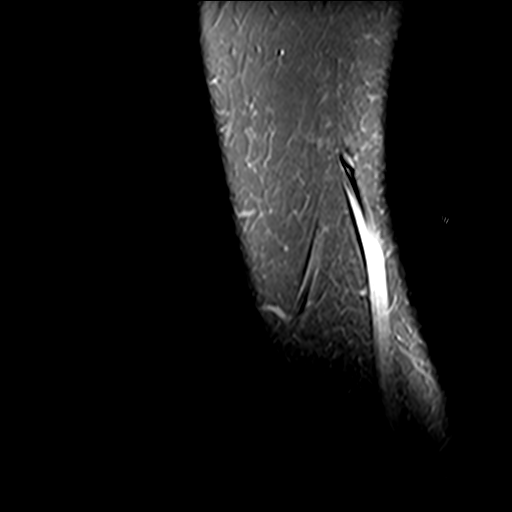
[im 6/32]
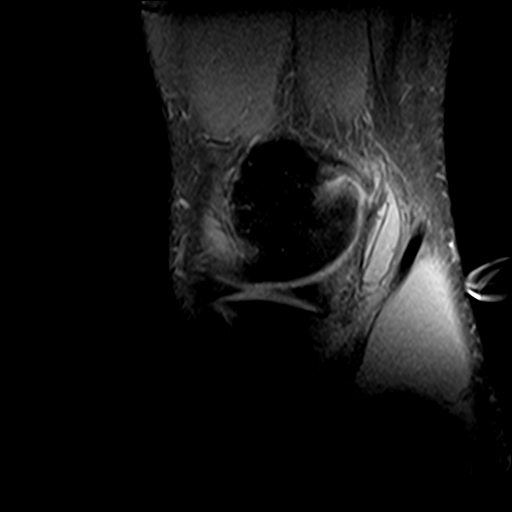
[im 11/32]
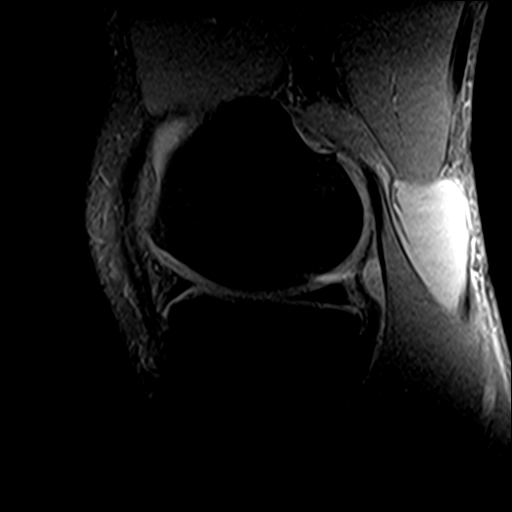
[im 16/32]
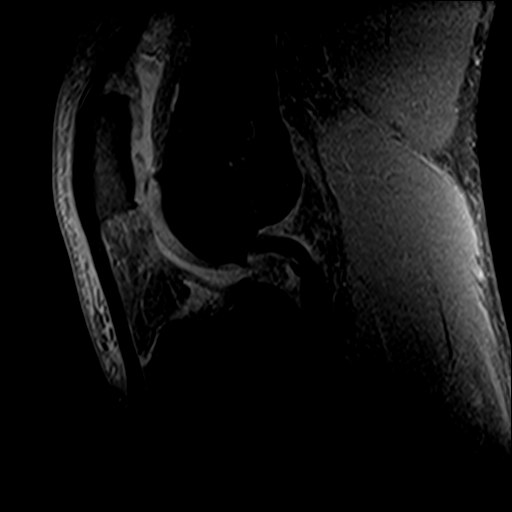
[im 21/32]
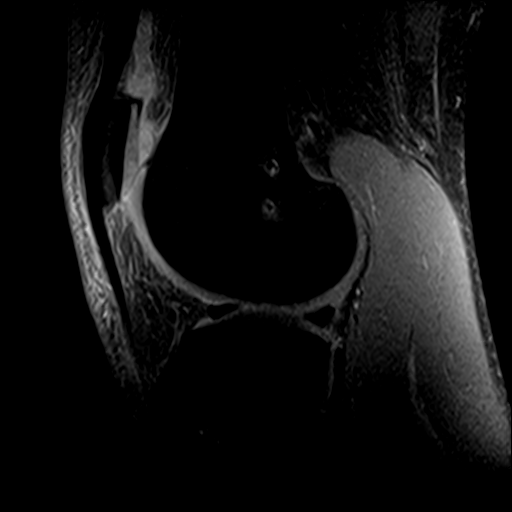
[im 26/32]
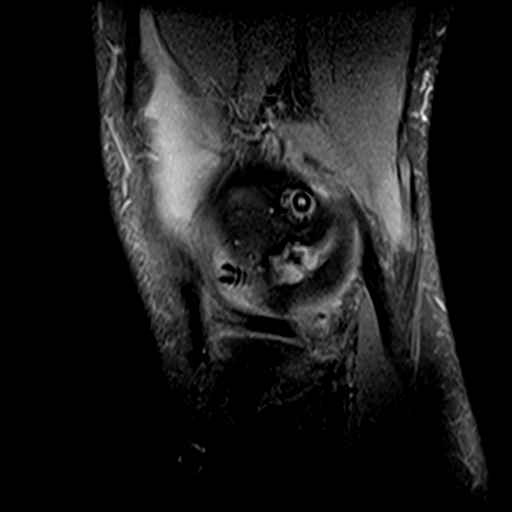
[im 32/32]
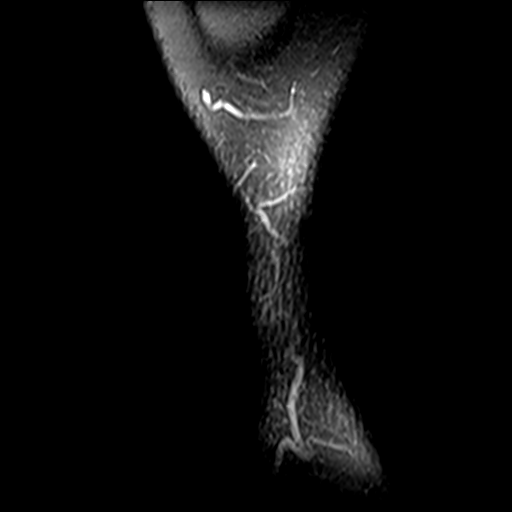

[Series 9: PD fat-sat · coronal · 2.3mm · 0.29mm/px · 2 of 11 slices shown (3 of 3)]
[im 1/11]
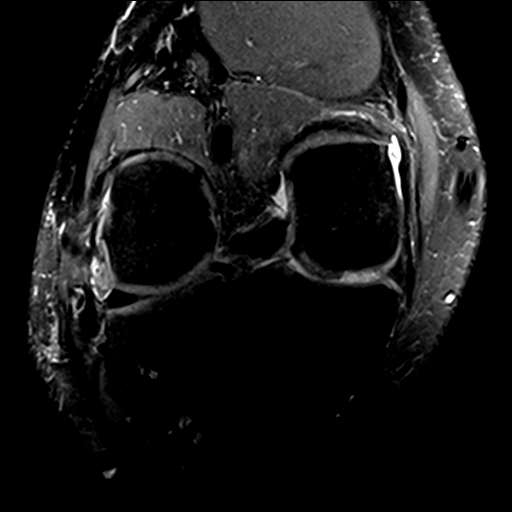
[im 11/11]
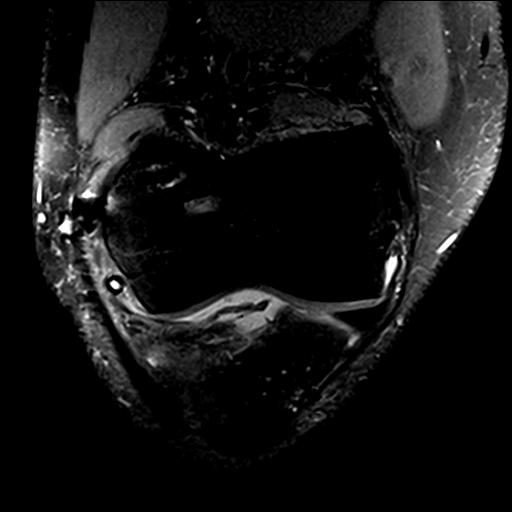

[22 of 40 positions shown; findings below may reference images not displayed]

FINDINGS: MENISCI

Medial meniscus: Slight loss of substance of the medial meniscal
body at site of previously seen meniscal tear suggesting interval
repair. No evidence for a new or recurrent tear at this site.

Lateral meniscus:  Intact.

LIGAMENTS

Cruciates:  Intact ACL and PCL.

Collaterals: Intact MCL. Interval surgical repair of the fibular
collateral ligament, which appears intact. IT band and biceps
femoris tendon attachments appear intact.

CARTILAGE

Patellofemoral: Patellar cartilage is intact without visible fissure
or defect, improved in appearance from prior. No trochlear cartilage
abnormality.

Medial: New full-thickness cartilage defect involving the
weight-bearing medial femoral condyle (series 6, image 15; series 7,
image 24) measuring approximately 5 x 7 mm.

Lateral:  No chondral defect.

Joint: Small joint effusion. Mild edema within the superolateral
aspect of Hoffa's fat.

Popliteal Fossa: Large Baker's cyst with internal complexity and a
small amount of debris. Distal popliteus tendon is intermediate in
signal intensity which may reflect tendinosis and/or partial tear.

Extensor Mechanism:  Intact quadriceps tendon and patellar tendon.

Bones: Susceptibility from surgical anchors the lateral aspect of
the knee. Mild residual subchondral marrow changes at the patella,
less prominent compared to prior.

Other: None.
IMPRESSION: 1. New 5 x 7 mm full-thickness cartilage defect involving the
weight-bearing medial femoral condyle.
2. Large Baker's cyst with internal complexity and a small amount of
debris.
3. Distal popliteus tendinosis and/or partial tear.
4. Interval surgical repair of the medial meniscal body. No evidence
for a new or recurrent tear.
5. Intact fibular collateral ligament status post repair.

## 2020-02-04 ENCOUNTER — Encounter: Payer: Self-pay | Admitting: Family Medicine

## 2020-02-11 ENCOUNTER — Ambulatory Visit: Payer: BC Managed Care – PPO | Attending: Family

## 2020-02-11 DIAGNOSIS — Z23 Encounter for immunization: Secondary | ICD-10-CM

## 2020-02-11 NOTE — Progress Notes (Signed)
   Covid-19 Vaccination Clinic  Name:  Jorge Lee    MRN: [id number] DOB: 08/23/1986  02/11/2020  Jorge Lee was observed post Covid-19 immunization for 15 minutes without incident. He was provided with Vaccine Information Sheet and instruction to access the V-Safe system.   Jorge Lee was instructed to call 911 with any severe reactions post vaccine: Marland Kitchen Difficulty breathing  . Swelling of face and throat  . A fast heartbeat  . A bad rash all over body  . Dizziness and weakness   Immunizations Administered    Name Date Dose VIS Date Route   Moderna COVID-19 Vaccine 02/11/2020 11:37 AM 0.5 mL 10/15/2019 Intramuscular   Manufacturer: Moderna   Lot: [id number]   NDC: 22336-122-44

## 2020-02-18 ENCOUNTER — Encounter: Payer: Self-pay | Admitting: Family Medicine

## 2020-02-18 ENCOUNTER — Other Ambulatory Visit: Payer: Self-pay

## 2020-02-18 ENCOUNTER — Ambulatory Visit (INDEPENDENT_AMBULATORY_CARE_PROVIDER_SITE_OTHER): Payer: BC Managed Care – PPO

## 2020-02-18 ENCOUNTER — Ambulatory Visit: Payer: BC Managed Care – PPO | Admitting: Family Medicine

## 2020-02-18 VITALS — BP 120/88 | HR 70 | Ht 69.0 in | Wt 208.0 lb

## 2020-02-18 DIAGNOSIS — G8929 Other chronic pain: Secondary | ICD-10-CM

## 2020-02-18 DIAGNOSIS — M25561 Pain in right knee: Secondary | ICD-10-CM | POA: Diagnosis not present

## 2020-02-18 DIAGNOSIS — M93261 Osteochondritis dissecans, right knee: Secondary | ICD-10-CM

## 2020-02-18 NOTE — Assessment & Plan Note (Signed)
Chronic problem with exacerbation.  Aspiration done again.  Patient continues to have a recurrent swelling.  Did see orthopedic surgery who discussed not doing any type of surgical intervention at the time of concern with patient continuing to have discomfort and pain.  Patient is not having any significant instability just the recurrent inflammation.  We will see how patient does and continue the compression, icing, anti-inflammatories.  Discussed the potential need for gabapentin but more as needed.  Follow-up again in 6 to 12 weeks.

## 2020-02-18 NOTE — Patient Instructions (Addendum)
See me again in 10weeks 

## 2020-02-18 NOTE — Progress Notes (Signed)
[facility] [place] Phone: [phone] Subjective:   Jorge Lee, am serving as a scribe for Dr. Hulan Saas. This visit occurred during the SARS-CoV-2 public health emergency.  Safety protocols were in place, including screening questions prior to the visit, additional usage of staff PPE, and extensive cleaning of exam room while observing appropriate contact time as indicated for disinfecting solutions.   I'm seeing this patient by the request  of:  Vernie Shanks, MD  CC: Knee pain follow-up  FKC:[id number]  Jorge Lee is a 34 y.o. male coming in with complaint of right knee pain. Patient states he does feel a fullness in the back.  Did send patient to orthopedic surgery to discuss the possibility of surgical intervention but patient held at this time.  Discussed continuing with the aspiration.       Past Medical History:  Diagnosis Date  . Diabetes mellitus without complication ([place])    Lee past surgical history on file. Social History   Socioeconomic History  . Marital status: Single    Spouse name: Not on file  . Number of children: Not on file  . Years of education: Not on file  . Highest education level: Not on file  Occupational History  . Not on file  Tobacco Use  . Smoking status: Never Smoker  . Smokeless tobacco: Never Used  Substance and Sexual Activity  . Alcohol use: Yes  . Drug use: Lee  . Sexual activity: Not on file  Other Topics Concern  . Not on file  Social History Narrative  . Not on file   Social Determinants of Health   Financial Resource Strain:   . Difficulty of Paying Living Expenses:   Food Insecurity:   . Worried About Charity fundraiser in the Last Year:   . Arboriculturist in the Last Year:   Transportation Needs:   . Film/video editor (Medical):   Marland Kitchen Lack of Transportation (Non-Medical):   Physical Activity:   . Days of Exercise per Week:   .  Minutes of Exercise per Session:   Stress:   . Feeling of Stress :   Social Connections:   . Frequency of Communication with Friends and Family:   . Frequency of Social Gatherings with Friends and Family:   . Attends Religious Services:   . Active Member of Clubs or Organizations:   . Attends Archivist Meetings:   Marland Kitchen Marital Status:    Lee Known Allergies Lee family history on file.     Current Outpatient Medications (Analgesics):  .  ibuprofen (ADVIL,MOTRIN) 200 MG tablet, Take 200-400 mg by mouth every 6 (six) hours as needed for headache or mild pain.   Current Outpatient Medications (Other):  .  amphetamine-dextroamphetamine (ADDERALL) 20 MG tablet, Take 10 mg by mouth 2 (two) times daily. Marland Kitchen  gabapentin (NEURONTIN) 100 MG capsule, Take 2 capsules (200 mg total) by mouth at bedtime. .  Multiple Vitamins-Minerals (MULTIVITAMIN WITH MINERALS) tablet, Take 1 tablet by mouth daily. .  Vitamin D, Ergocalciferol, (DRISDOL) 1.25 MG (50000 UNIT) CAPS capsule, Take 1 capsule (50,000 Units total) by mouth every 7 (seven) days.   Reviewed prior external information including notes and imaging from  primary care provider As well as notes that were available from care everywhere and other healthcare systems.  Past medical history, social, surgical and family history all reviewed in electronic medical record.  Lee pertanent information  unless stated regarding to the chief complaint.   Review of Systems:  Lee headache, visual changes, nausea, vomiting, diarrhea, constipation, dizziness, abdominal pain, skin rash, fevers, chills, night sweats, weight loss, swollen lymph nodes, body aches, chest pain, shortness of breath, mood changes. POSITIVE muscle aches, joint swelling  Objective  Blood pressure 120/88, pulse 70, height 5\' 9"  (1.753 m), weight 208 lb (94.3 kg), SpO2 99 %.   General: Lee apparent distress alert and oriented x3 mood and affect normal, dressed appropriately.  HEENT:  Pupils equal, extraocular movements intact  Respiratory: Patient's speak in full sentences and does not appear short of breath  Cardiovascular: Lee lower extremity edema, non tender, Lee erythema  Neuro: Cranial nerves II through XII are intact, neurovascularly intact in all extremities with 2+ DTRs and 2+ pulses.  Gait normal with good balance and coordination.  MSK: Right knee exam continues to have swelling noted in the popliteal region of the posterior knee.  Still some mild laxity of the ACL compared to the contralateral side but endpoint noted.  Patient has had improvement in foot drop on the right side.  Procedure: Real-time Ultrasound Guided Injection of right knee Device: GE Logiq Q7 Ultrasound guided injection is preferred based studies that show increased duration, increased effect, greater accuracy, decreased procedural pain, increased response rate, and decreased cost with ultrasound guided versus blind injection.  Verbal informed consent obtained.  Time-out conducted.  Noted Lee overlying erythema, induration, or other signs of local infection.  Skin prepped in a sterile fashion.  Local anesthesia: Topical Ethyl chloride.  With sterile technique and under real time ultrasound guidance: With a 22-gauge 2 inch needle patient was injected with 4 cc of 0.5% Marcaine and aspirated 50 cc of straw-colored fluid then injected 1 cc of Kenalog 40 mg/dL. This was from a posterior approach.  Completed without difficulty  Pain immediately resolved suggesting accurate placement of the medication.  Advised to call if fevers/chills, erythema, induration, drainage, or persistent bleeding.  Images permanently stored and available for review in the ultrasound unit.  Impression: Technically successful ultrasound guided injection.   Impression and Recommendations:     This case required medical decision making of moderate complexity. The above documentation has been reviewed and is accurate and  complete , DO       Note: This dictation was prepared with Dragon dictation along with smaller phrase technology. Any transcriptional errors that result from this process are unintentional.

## 2020-03-01 ENCOUNTER — Encounter: Payer: Self-pay | Admitting: Family Medicine

## 2020-03-03 ENCOUNTER — Encounter: Payer: Self-pay | Admitting: Family Medicine

## 2020-03-03 ENCOUNTER — Ambulatory Visit: Payer: BC Managed Care – PPO | Admitting: Family Medicine

## 2020-03-03 ENCOUNTER — Other Ambulatory Visit: Payer: Self-pay

## 2020-03-03 ENCOUNTER — Ambulatory Visit (INDEPENDENT_AMBULATORY_CARE_PROVIDER_SITE_OTHER): Payer: BC Managed Care – PPO

## 2020-03-03 VITALS — BP 120/80 | HR 66 | Ht 69.0 in | Wt 203.0 lb

## 2020-03-03 DIAGNOSIS — G8929 Other chronic pain: Secondary | ICD-10-CM

## 2020-03-03 DIAGNOSIS — M25561 Pain in right knee: Secondary | ICD-10-CM | POA: Diagnosis not present

## 2020-03-03 DIAGNOSIS — M93261 Osteochondritis dissecans, right knee: Secondary | ICD-10-CM

## 2020-03-03 NOTE — Assessment & Plan Note (Signed)
Patient has had unstable and potentially had some movement.  There is a small effusion noted at this time.  We discussed the potential for aspirating to make sure no type of infectious etiology but patient does look relatively well at the moment.  Patient declined.  We discussed icing regimen and home exercise, which activities to do which wants to avoid.  Patient is to increase activity slowly over the course the next several weeks.  Follow-up again in 4 to 8 weeks.

## 2020-03-03 NOTE — Progress Notes (Signed)
[name] [address] [place] [address] Phone: [phone] Subjective:   I [name] am serving as a Neurosurgeon for Dr. Antoine Primas.  This visit occurred during the SARS-CoV-2 public health emergency.  Safety protocols were in place, including screening questions prior to the visit, additional usage of staff PPE, and extensive cleaning of exam room while observing appropriate contact time as indicated for disinfecting solutions.   I'm seeing this patient by the request  of:  Ileana Ladd, MD  CC: Right knee pain  [id number]   02/18/2020 Chronic problem with exacerbation.  Aspiration done again.  Patient continues to have a recurrent swelling.  Did see orthopedic surgery who discussed not doing any type of surgical intervention at the time of concern with patient continuing to have discomfort and pain.  Patient is not having any significant instability just the recurrent inflammation.  We will see how patient does and continue the compression, icing, anti-inflammatories.  Discussed the potential need for gabapentin but more as needed.  Follow-up again in 6 to 12 weeks.  03/03/2020 Jorge Lee is a 34 y.o. male coming in with complaint of right knee pain. Patient states he is currently doing well. Sunday he had sharp lateral knee pain. Ice the knee and it felt normal again. Monday morning he had a "crackly" feeling in the quad tendon area. Pain happened again earlier today. Lateral and medial pain.  Patient states that the posterior aspect of the knee is better since last drainage.  Patient wife was concerned for the potential for an infection but states that patient is is is coming and going with the pain at the moment actually the pain is significantly improved.  Did do a 11 mile run which is the longest run he has done this year.       Past Medical History:  Diagnosis Date  . Diabetes mellitus without complication (HCC)    No past  surgical history on file. Social History   Socioeconomic History  . Marital status: Single    Spouse name: Not on file  . Number of children: Not on file  . Years of education: Not on file  . Highest education level: Not on file  Occupational History  . Not on file  Tobacco Use  . Smoking status: Never Smoker  . Smokeless tobacco: Never Used  Substance and Sexual Activity  . Alcohol use: Yes  . Drug use: No  . Sexual activity: Not on file  Other Topics Concern  . Not on file  Social History Narrative  . Not on file   Social Determinants of Health   Financial Resource Strain:   . Difficulty of Paying Living Expenses:   Food Insecurity:   . Worried About Programme researcher, broadcasting/film/video in the Last Year:   . Barista in the Last Year:   Transportation Needs:   . Freight forwarder (Medical):   Marland Kitchen Lack of Transportation (Non-Medical):   Physical Activity:   . Days of Exercise per Week:   . Minutes of Exercise per Session:   Stress:   . Feeling of Stress :   Social Connections:   . Frequency of Communication with Friends and Family:   . Frequency of Social Gatherings with Friends and Family:   . Attends Religious Services:   . Active Member of Clubs or Organizations:   . Attends Banker Meetings:   Marland Kitchen Marital Status:    No Known  Allergies No family history on file.     Current Outpatient Medications (Analgesics):  .  ibuprofen (ADVIL,MOTRIN) 200 MG tablet, Take 200-400 mg by mouth every 6 (six) hours as needed for headache or mild pain.   Current Outpatient Medications (Other):  .  amphetamine-dextroamphetamine (ADDERALL) 20 MG tablet, Take 10 mg by mouth 2 (two) times daily. Marland Kitchen  gabapentin (NEURONTIN) 100 MG capsule, Take 2 capsules (200 mg total) by mouth at bedtime. .  Multiple Vitamins-Minerals (MULTIVITAMIN WITH MINERALS) tablet, Take 1 tablet by mouth daily. .  Vitamin D, Ergocalciferol, (DRISDOL) 1.25 MG (50000 UNIT) CAPS capsule, Take 1 capsule  (50,000 Units total) by mouth every 7 (seven) days.   Reviewed prior external information including notes and imaging from  primary care provider As well as notes that were available from care everywhere and other healthcare systems.  Past medical history, social, surgical and family history all reviewed in electronic medical record.  No pertanent information unless stated regarding to the chief complaint.   Review of Systems:  No headache, visual changes, nausea, vomiting, diarrhea, constipation, dizziness, abdominal pain, skin rash, fevers, chills, night sweats, weight loss, swollen lymph nodes, body aches, joint swelling, chest pain, shortness of breath, mood changes. POSITIVE muscle aches  Objective  Blood pressure 120/80, pulse 66, height 5\' 9"  (1.753 m), weight 203 lb (92.1 kg), SpO2 95 %.   General: No apparent distress alert and oriented x3 mood and affect normal, dressed appropriately.  HEENT: Pupils equal, extraocular movements intact  Respiratory: Patient's speak in full sentences and does not appear short of breath  Cardiovascular: No lower extremity edema, non tender, no erythema  Neuro: Cranial nerves II through XII are intact, neurovascularly intact in all extremities with 2+ DTRs and 2+ pulses.  Gait normal with good balance and coordination.  MSK:  Non tender with full range of motion and good stability and symmetric strength and tone of shoulders, elbows, wrist, hip and ankles bilaterally.  Patient's knee on the left side does have a small effusion noted.  Some mild pain over the lateral aspect of the patella noted.  No significant increase in instability of the knee still has some mild laxity of the LCL noted.  Musculoskeletal ultrasound was performed and interpreted by Lyndal Pulley  Limited musculoskeletal ultrasound shows the patient does have an effusion noted that is noted.  No sign of any infectious etiology.  Very minimal synovitis noted.  No significant  enlargement of the Baker cyst noted at that time and actually improvement from previous exam   Impression and Recommendations:     This case required medical decision making of moderate complexity. The above documentation has been reviewed and is accurate and complete Lyndal Pulley, DO       Note: This dictation was prepared with Dragon dictation along with smaller phrase technology. Any transcriptional errors that result from this process are unintentional.

## 2020-03-03 NOTE — Patient Instructions (Signed)
Duexis 3 times a day for 3 days Ice after activity Ok to start running after the weekend

## 2020-03-31 ENCOUNTER — Encounter: Payer: Self-pay | Admitting: Family Medicine

## 2020-04-27 ENCOUNTER — Other Ambulatory Visit: Payer: Self-pay

## 2020-04-27 ENCOUNTER — Encounter: Payer: Self-pay | Admitting: Family Medicine

## 2020-04-27 ENCOUNTER — Ambulatory Visit: Payer: BC Managed Care – PPO | Admitting: Family Medicine

## 2020-04-27 DIAGNOSIS — M93261 Osteochondritis dissecans, right knee: Secondary | ICD-10-CM

## 2020-04-27 NOTE — Progress Notes (Signed)
[facility] [place] Phone: [phone] Subjective:   Jorge Lee, am serving as a scribe for Dr. Hulan Saas. This visit occurred during the SARS-CoV-2 public health emergency.  Safety protocols were in place, including screening questions prior to the visit, additional usage of staff PPE, and extensive cleaning of exam room while observing appropriate contact time as indicated for disinfecting solutions.   I'm seeing this patient by the request  of:  Vernie Shanks, MD  CC: Knee pain follow-up  [id number]   03/03/2020 Patient has had unstable and potentially had some movement.  There is a small effusion noted at this time.  We discussed the potential for aspirating to make sure Lee type of infectious etiology but patient does look relatively well at the moment.  Patient declined.  We discussed icing regimen and home exercise, which activities to do which wants to avoid.  Patient is to increase activity slowly over the course the next several weeks.  Follow-up again in 4 to 8 weeks.  Update 04/27/2020 Jorge Lee is a 34 y.o. male coming in with complaint of right knee pain. Ran May 1st, 10 miles. Has a lot of swelling and then it has not come back since. If he does have pain he will have pain over medial aspect.  Patient states remembering feeling very good.  Has had very minimal discomfort when he runs greater than 5 miles at one time but otherwise nothing.    Past Medical History:  Diagnosis Date  . Diabetes mellitus without complication ([place])    Lee past surgical history on file. Social History   Socioeconomic History  . Marital status: Single    Spouse name: Not on file  . Number of children: Not on file  . Years of education: Not on file  . Highest education level: Not on file  Occupational History  . Not on file  Tobacco Use  . Smoking status: Never Smoker  . Smokeless tobacco: Never Used    Substance and Sexual Activity  . Alcohol use: Yes  . Drug use: Lee  . Sexual activity: Not on file  Other Topics Concern  . Not on file  Social History Narrative  . Not on file   Social Determinants of Health   Financial Resource Strain:   . Difficulty of Paying Living Expenses:   Food Insecurity:   . Worried About Charity fundraiser in the Last Year:   . Arboriculturist in the Last Year:   Transportation Needs:   . Film/video editor (Medical):   Marland Kitchen Lack of Transportation (Non-Medical):   Physical Activity:   . Days of Exercise per Week:   . Minutes of Exercise per Session:   Stress:   . Feeling of Stress :   Social Connections:   . Frequency of Communication with Friends and Family:   . Frequency of Social Gatherings with Friends and Family:   . Attends Religious Services:   . Active Member of Clubs or Organizations:   . Attends Archivist Meetings:   Marland Kitchen Marital Status:    Lee Known Allergies Lee family history on file.     Current Outpatient Medications (Analgesics):  .  ibuprofen (ADVIL,MOTRIN) 200 MG tablet, Take 200-400 mg by mouth every 6 (six) hours as needed for headache or mild pain.   Current Outpatient Medications (Other):  .  amphetamine-dextroamphetamine (ADDERALL) 20 MG tablet, Take 10 mg by mouth  2 (two) times daily. Marland Kitchen  gabapentin (NEURONTIN) 100 MG capsule, Take 2 capsules (200 mg total) by mouth at bedtime. .  Multiple Vitamins-Minerals (MULTIVITAMIN WITH MINERALS) tablet, Take 1 tablet by mouth daily. .  Vitamin D, Ergocalciferol, (DRISDOL) 1.25 MG (50000 UNIT) CAPS capsule, Take 1 capsule (50,000 Units total) by mouth every 7 (seven) days.   Reviewed prior external information including notes and imaging from  primary care provider As well as notes that were available from care everywhere and other healthcare systems.  Past medical history, social, surgical and family history all reviewed in electronic medical record.  Lee pertanent  information unless stated regarding to the chief complaint.   Review of Systems:  Lee headache, visual changes, nausea, vomiting, diarrhea, constipation, dizziness, abdominal pain, skin rash, fevers, chills, night sweats, weight loss, swollen lymph nodes, body aches, joint swelling, chest pain, shortness of breath, mood changes. POSITIVE muscle aches  Objective  Blood pressure 132/82, pulse 76, height 5\' 9"  (1.753 m), weight 208 lb (94.3 kg), SpO2 98 %.   General: Lee apparent distress alert and oriented x3 mood and affect normal, dressed appropriately.  HEENT: Pupils equal, extraocular movements intact  Respiratory: Patient's speak in full sentences and does not appear short of breath  Cardiovascular: Lee lower extremity edema, non tender, Lee erythema  Neuro: Cranial nerves II through XII are intact, neurovascularly intact in all extremities with 2+ DTRs and 2+ pulses.  Gait normal with good balance and coordination.  MSK:  Non tender with full range of motion and good stability and symmetric strength and tone of shoulders, elbows, wrist, hip, and ankles bilaterally.  Right knee full rom still mild ttp on medial joint line     Impression and Recommendations:     The above documentation has been reviewed and is accurate and complete , DO       Note: This dictation was prepared with Dragon dictation along with smaller phrase technology. Any transcriptional errors that result from this process are unintentional.

## 2020-04-27 NOTE — Assessment & Plan Note (Signed)
Patient is doing remarkably well at this time.  With patient doing so well I would encourage him to continue the same vitamin supplementation increase activity as tolerated.  Follow-up as needed

## 2020-04-28 ENCOUNTER — Ambulatory Visit: Payer: BC Managed Care – PPO | Admitting: Family Medicine

## 2020-07-10 ENCOUNTER — Other Ambulatory Visit: Payer: Self-pay | Admitting: Family Medicine

## 2020-07-26 ENCOUNTER — Encounter: Payer: Self-pay | Admitting: Family Medicine

## 2020-09-04 ENCOUNTER — Encounter: Payer: Self-pay | Admitting: Family Medicine

## 2020-09-08 NOTE — Progress Notes (Signed)
Jorge Lee [address] [place] [address] Phone: [phone] Subjective:   Jorge Lee, am serving as a scribe for Dr. Antoine Lee. This visit occurred during the SARS-CoV-2 public health emergency.  Safety protocols were in place, including screening questions prior to the visit, additional usage of staff PPE, and extensive cleaning of exam room while observing appropriate contact time as indicated for disinfecting solutions.   I'm seeing this patient by the request  of:  Ileana Ladd, MD  CC: Knee pain follow-up  [id number]   04/27/2020 Patient is doing remarkably well at this time.  With patient doing so well I would encourage him to continue the same vitamin supplementation increase activity as tolerated.  Follow-up as needed   Update 09/09/2020 Jorge Lee is a 34 y.o. male coming in with complaint of right knee pain. Patient states that Monday he had some pain after sitting over lateral aspect of knee over incision. Ran 5 miles which didn't make it worse. Swelling in back of knee again. Using compression sleeve with running.  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  Patient has done really well though otherwise       Past Medical History:  Diagnosis Date  . Diabetes mellitus without complication (HCC)    No past surgical history on file. Social History   Socioeconomic History  . Marital status: Single    Spouse name: Not on file  . Number of children: Not on file  . Years of education: Not on file  . Highest education level: Not on file  Occupational History  . Not on file  Tobacco Use  . Smoking status: Never Smoker  . Smokeless tobacco: Never Used  Substance and Sexual Activity  . Alcohol use: Yes  . Drug use: No  . Sexual activity: Not on file  Other Topics Concern  . Not on file  Social History Narrative  . Not on file   Social Determinants of Health   Financial Resource  Strain:   . Difficulty of Paying Living Expenses: Not on file  Food Insecurity:   . Worried About Programme researcher, broadcasting/film/video in the Last Year: Not on file  . Ran Out of Food in the Last Year: Not on file  Transportation Needs:   . Lack of Transportation (Medical): Not on file  . Lack of Transportation (Non-Medical): Not on file  Physical Activity:   . Days of Exercise per Week: Not on file  . Minutes of Exercise per Session: Not on file  Stress:   . Feeling of Stress : Not on file  Social Connections:   . Frequency of Communication with Friends and Family: Not on file  . Frequency of Social Gatherings with Friends and Family: Not on file  . Attends Religious Services: Not on file  . Active Member of Clubs or Organizations: Not on file  . Attends Banker Meetings: Not on file  . Marital Status: Not on file   No Known Allergies No family history on file.     Current Outpatient Medications (Analgesics):  .  ibuprofen (ADVIL,MOTRIN) 200 MG tablet, Take 200-400 mg by mouth every 6 (six) hours as needed for headache or mild pain.   Current Outpatient Medications (Other):  .  amphetamine-dextroamphetamine (ADDERALL) 20 MG tablet, Take 10 mg by mouth 2 (two) times daily. .  Multiple Vitamins-Minerals (MULTIVITAMIN WITH MINERALS) tablet, Take 1 tablet by mouth daily. Marland Kitchen  Vitamin D, Ergocalciferol, (DRISDOL) 1.25 MG (50000 UNIT) CAPS capsule, TAKE 1 CAPSULE (50,000 UNITS TOTAL) BY MOUTH EVERY 7 (SEVEN) DAYS. Marland Kitchen  gabapentin (NEURONTIN) 100 MG capsule, Take 2 capsules (200 mg total) by mouth at bedtime.   Reviewed prior external information including notes and imaging from  primary care provider As well as notes that were available from care everywhere and other healthcare systems.  Past medical history, social, surgical and family history all reviewed in electronic medical record.  No pertanent information unless stated regarding to the chief complaint.   Review of Systems:  No  headache, visual changes, nausea, vomiting, diarrhea, constipation, dizziness, abdominal pain, skin rash, fevers, chills, night sweats, weight loss, swollen lymph nodes, body aches, joint swelling, chest pain, shortness of breath, mood changes. POSITIVE muscle aches  Objective  Blood pressure 118/78, pulse 75, height 5\' 9"  (1.753 m), weight 212 lb (96.2 kg), SpO2 99 %.   General: No apparent distress alert and oriented x3 mood and affect normal, dressed appropriately.  HEENT: Pupils equal, extraocular movements intact  Respiratory: Patient's speak in full sentences and does not appear short of breath  Cardiovascular: No lower extremity edema, non tender, no erythema  Neuro: Cranial nerves II through XII are intact, neurovascularly intact in all extremities with 2+ DTRs and 2+ pulses.  Gait normal with good balance and coordination.  MSK: Right knee exam shows the patient does have an effusion.  Lacks the last 15 degrees of flexion.  Full extension.  Patient has no significant instability but does have pain over the lateral joint space.  Large cyst noted in the popliteal area.  Procedure: Real-time Ultrasound Guided Injection of left knee Device: GE Logiq Q7 Ultrasound guided injection is preferred based studies that show increased duration, increased effect, greater accuracy, decreased procedural pain, increased response rate, and decreased cost with ultrasound guided versus blind injection.  Verbal informed consent obtained.  Time-out conducted.  Noted no overlying erythema, induration, or other signs of local infection.  Skin prepped in a sterile fashion.  Local anesthesia: Topical Ethyl chloride.  With sterile technique and under real time ultrasound guidance: With a 22-gauge 2 inch needle patient was injected with 4 cc of 0.5% Marcaine then aspirated 55 cc of straw-colored fluid and injected 1 cc of Kenalog 40 mg/dL. This was from a posterior approach.  Completed without difficulty  Pain  immediately resolved suggesting accurate placement of the medication.  Advised to call if fevers/chills, erythema, induration, drainage, or persistent bleeding.  Images permanently stored and available for review in the ultrasound unit.  Impression: Technically successful ultrasound guided injection.     Impression and Recommendations:     The above documentation has been reviewed and is accurate and complete , DO

## 2020-09-09 ENCOUNTER — Ambulatory Visit: Payer: Self-pay

## 2020-09-09 ENCOUNTER — Other Ambulatory Visit: Payer: Self-pay

## 2020-09-09 ENCOUNTER — Encounter: Payer: Self-pay | Admitting: Family Medicine

## 2020-09-09 ENCOUNTER — Ambulatory Visit: Payer: BC Managed Care – PPO | Admitting: Family Medicine

## 2020-09-09 VITALS — BP 118/78 | HR 75 | Ht 69.0 in | Wt 212.0 lb

## 2020-09-09 DIAGNOSIS — M25561 Pain in right knee: Secondary | ICD-10-CM

## 2020-09-09 DIAGNOSIS — M7121 Synovial cyst of popliteal space [Baker], right knee: Secondary | ICD-10-CM | POA: Diagnosis not present

## 2020-09-09 DIAGNOSIS — G8929 Other chronic pain: Secondary | ICD-10-CM

## 2020-09-09 DIAGNOSIS — M93261 Osteochondritis dissecans, right knee: Secondary | ICD-10-CM | POA: Diagnosis not present

## 2020-09-09 NOTE — Patient Instructions (Signed)
Drained knee again today Continue wearing compression sleeve

## 2020-09-09 NOTE — Assessment & Plan Note (Signed)
Chronic problem with exacerbation.  Aspiration done again today.  Patient has a history of the OCD but not having any pain or instability of the knee.  We discussed with patient that if worsening surgical intervention would be necessary.  Patient was able to run as well as all activities of daily living without having to take any significant medications.  Patient is not having too much trouble and just needs aspiration from time to time.  Patient did have aspiration today, will try compression daily for a little bit longer, if it reaccumulates will further evaluate follow-up with me again as needed

## 2020-09-28 ENCOUNTER — Other Ambulatory Visit: Payer: Self-pay | Admitting: Family Medicine

## 2020-12-14 ENCOUNTER — Other Ambulatory Visit: Payer: Self-pay | Admitting: Family Medicine

## 2022-03-03 ENCOUNTER — Ambulatory Visit: Payer: Self-pay

## 2022-03-03 ENCOUNTER — Ambulatory Visit: Payer: BC Managed Care – PPO | Admitting: Family Medicine

## 2022-03-03 VITALS — BP 152/96 | HR 64 | Ht 69.0 in | Wt 212.6 lb

## 2022-03-03 DIAGNOSIS — G8929 Other chronic pain: Secondary | ICD-10-CM | POA: Diagnosis not present

## 2022-03-03 DIAGNOSIS — M7121 Synovial cyst of popliteal space [Baker], right knee: Secondary | ICD-10-CM

## 2022-03-03 DIAGNOSIS — M93261 Osteochondritis dissecans, right knee: Secondary | ICD-10-CM | POA: Diagnosis not present

## 2022-03-03 DIAGNOSIS — M25561 Pain in right knee: Secondary | ICD-10-CM | POA: Diagnosis not present

## 2022-03-03 NOTE — Progress Notes (Signed)
? ?  I, Christoper Fabian, LAT, ATC, am serving as scribe for Dr. Clementeen Graham. ? ?Jorge Lee is a 36 y.o. 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  He was last seen by Dr. Katrinka Blazing on 09/09/20 and had a R knee aspiration/injection.  Today, pt reports R knee started hurting about 1 month ago. Pt locates pain to the posterior, slightly medial, aspect of the R knee.  ? ?Diagnostic testing: R knee MRI- 10/23/19 ? ?Pertinent review of systems: No fevers or chills ? ?Relevant historical information: Baker's cyst ? ? ?Exam:  ?BP (!) 152/96   Pulse 64   Ht 5\' 9"  (1.753 m)   Wt 212 lb 9.6 oz (96.4 kg)   SpO2 97%   BMI 31.40 kg/m?  ?General: Well Developed, well nourished, and in no acute distress.  ? ?MSK: Right knee mild effusion otherwise normal. ?Normal motion. ?Nontender. ?Stable ligamentous exam. ? ? ? ?Lab and Radiology Results ? ?Procedure: Real-time Ultrasound Guided aspiration and injection of right knee Baker's cyst ?Device: Philips Affiniti 50G ?Images permanently stored and available for review in PACS ?Verbal informed consent obtained.  Discussed risks and benefits of procedure. Warned about infection, bleeding, hyperglycemia damage to structures among others. ?Patient expresses understanding and agreement ?Time-out conducted.   ?Noted no overlying erythema, induration, or other signs of local infection.   ?Skin prepped in a sterile fashion.   ?Local anesthesia: Topical Ethyl chloride.   ?With sterile technique and under real time ultrasound guidance: 3 mL of lidocaine injected into subcutaneous tissue superficial to Baker's cyst. ?Skin again sterilized with isopropyl alcohol. ?18-gauge needle was used to access the Baker's cyst. ?30 mL of clear yellow fluid aspirated. ?Cyst was seen to be decompressed after the aspiration and  ultrasound ?Syringe exchanged and 40 mg of Kenalog and 2 mL of Marcaine injected into the Baker's cyst ?Completed without difficulty   ?Pain immediately resolved suggesting accurate placement of the medication.   ?Advised to call if fevers/chills, erythema, induration, drainage, or persistent bleeding.   ?Images permanently stored and available for review in the ultrasound unit.  ?Impression: Technically successful ultrasound guided injection. ? ? ? 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  Most of his discomfort today is due to his Baker's cyst which was successfully aspirated and injected.  Recommend continued compression.  Modified his compression by recommending that he continue the compression for 30 to 60 minutes after exercise. ? ?Recheck back as needed. ? ? ?PDMP not reviewed this encounter. ?Orders Placed This Encounter  ?Procedures  ? 20 LIMITED JOINT SPACE STRUCTURES LOW RIGHT(NO LINKED CHARGES)  ?  Order Specific Question:   Reason for Exam (SYMPTOM  OR DIAGNOSIS REQUIRED)  ?  Answer:   R knee pain  ?  Order Specific Question:   Preferred imaging location?  ?  Answer:   [facility]  ? ?No orders of the defined types were placed in this encounter. ? ? ? ?Discussed warning signs or symptoms. Please see discharge instructions. Patient expresses understanding. ? ? ?The above documentation has been reviewed and is accurate and complete [facility], M.D. ? ? ?

## 2022-03-03 NOTE — Patient Instructions (Signed)
Thank you for coming in today.  ? ?Please use Voltaren gel (Generic Diclofenac Gel) up to 4x daily for pain as needed.  This is available over-the-counter as both the name brand Voltaren gel and the generic diclofenac gel.  ? ?I recommend you obtained a compression sleeve to help with your joint problems. There are many options on the market however I recommend obtaining a full knee Body Helix compression sleeve.  You can find information (including how to appropriate measure yourself for sizing) can be found at www.[email or web address].ch.  Many of these products are health savings account (HSA) eligible.  ? ?You can use the compression sleeve at any time throughout the day but is most important to use while being active as well as for 2 hours post-activity.   It is appropriate to ice following activity with the compression sleeve in place.  ? ?Return as needed.  ? ? ?

## 2022-08-22 ENCOUNTER — Ambulatory Visit: Payer: Self-pay

## 2022-08-22 ENCOUNTER — Ambulatory Visit: Payer: BC Managed Care – PPO | Admitting: Family Medicine

## 2022-08-22 VITALS — BP 138/92 | HR 64 | Ht 69.0 in | Wt 214.0 lb

## 2022-08-22 DIAGNOSIS — M7121 Synovial cyst of popliteal space [Baker], right knee: Secondary | ICD-10-CM | POA: Diagnosis not present

## 2022-08-22 DIAGNOSIS — G8929 Other chronic pain: Secondary | ICD-10-CM

## 2022-08-22 DIAGNOSIS — M25561 Pain in right knee: Secondary | ICD-10-CM | POA: Diagnosis not present

## 2022-08-22 NOTE — Patient Instructions (Addendum)
Thank you for coming in today.   You received an injection today. Seek immediate medical attention if the joint becomes red, extremely painful, or is oozing fluid.   Try the compression sleeve.

## 2022-08-22 NOTE — Progress Notes (Unsigned)
I, Philbert Riser, LAT, ATC acting as a scribe for Clementeen Graham, MD.  Jorge Lee is a 36 y.o. 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  .  Patient was last seen by Dr. Denyse Amass on 03/03/2022 and the Baker's cyst in his right knee was aspirated and injected with a steroid.  Patient was also advised to continue compression especially after activity.  Today, patient reports right knee pain returning and resolving back and forth over the last few weeks. Pt reports he had increased swelling and pain over this weekend.   R knee swelling: yes  Dx imaging: 10/23/19 R knee MRI  06/14/10 R knee XR  Pertinent review of systems: No fevers or chills  Relevant historical information: OCD right knee.     Exam:  BP (!) 138/92   Pulse 64   Ht 5\' 9"  (1.753 m)   Wt 214 lb (97.1 kg)   SpO2 98%   BMI 31.60 kg/m  General: Well Developed, well nourished, and in no acute distress.   MSK: Right knee visible swelling posterior medial knee.  Otherwise normal.  Normal knee motion. Nontender.    Lab and Radiology Results  Procedure: Real-time Ultrasound Guided aspiration and injection of right knee Baker's cyst Device: Philips Affiniti 50G Images permanently stored and available for review in PACS Verbal informed consent obtained.  Discussed risks and benefits of procedure. Warned about infection, bleeding, hyperglycemia damage to structures among others. Patient expresses understanding and agreement Time-out conducted.   Noted no overlying erythema, induration, or other signs of local infection.   Skin prepped in a sterile fashion.   Local anesthesia: Topical Ethyl chloride.   With sterile technique and under real time ultrasound guidance: 2 mL of lidocaine injected into subcutaneous tissue superficial to Baker's cyst. Skin again sterilized with isopropyl alcohol. 18-gauge needle used to access the  cyst. 20 mL of clear yellow fluid aspirated. Syringe exchanged and 40 mg of Kenalog and 2 mL of Marcaine injected into the now decompressed cyst  Fluid seen entering the decompressed cyst.   Completed without difficulty   Pain immediately resolved suggesting accurate placement of the medication.   Advised to call if fevers/chills, erythema, induration, drainage, or persistent bleeding.   Images permanently stored and available for review in the ultrasound unit.  Impression: Technically successful ultrasound guided aspiration and injection.        Assessment and Plan: 36 y.o. male with right knee Baker's cyst aspiration and injection.  This is a recurrent issue.  Last Baker's cyst aspiration and injection was performed in April of this year about 6 months ago.  We can continue to do this for as long as we need to.  Check back as needed.  Recommend compression sleeve.   PDMP not reviewed this encounter. Orders Placed This Encounter  Procedures   May LIMITED JOINT SPACE STRUCTURES LOW RIGHT(NO LINKED CHARGES)    Order Specific Question:   Reason for Exam (SYMPTOM  OR DIAGNOSIS REQUIRED)    Answer:   Right knee pain    Order Specific Question:   Preferred imaging location?    Answer:   [facility] Knee AP/LAT W/Sunrise Right    Standing Status:   Future    Standing Expiration Date:   09/22/2022    Order Specific Question:   Reason for Exam (SYMPTOM  OR DIAGNOSIS REQUIRED)  Answer:   Right knee pain    Order Specific Question:   Preferred imaging location?    Answer:   [facility]   No orders of the defined types were placed in this encounter.    Discussed warning signs or symptoms. Please see discharge instructions. Patient expresses understanding.   The above documentation has been reviewed and is accurate and complete Lynne Leader, M.D.

## 2022-12-12 ENCOUNTER — Ambulatory Visit: Payer: Self-pay

## 2022-12-12 ENCOUNTER — Ambulatory Visit: Payer: BC Managed Care – PPO | Admitting: Family Medicine

## 2022-12-12 VITALS — BP 130/84 | HR 69 | Ht 69.0 in | Wt 217.0 lb

## 2022-12-12 DIAGNOSIS — G8929 Other chronic pain: Secondary | ICD-10-CM | POA: Diagnosis not present

## 2022-12-12 DIAGNOSIS — M25561 Pain in right knee: Secondary | ICD-10-CM | POA: Diagnosis not present

## 2022-12-12 NOTE — Patient Instructions (Signed)
Thank you for coming in today.   Call or go to the ER if you develop a large red swollen joint with extreme pain or oozing puss.    If not better we can do more.   Anticipate next steps in April.  Let me know.

## 2022-12-12 NOTE — Progress Notes (Unsigned)
   I, [name], LAT, ATC acting as a scribe for Jorge Leader, MD.  Jorge Lee is a 37 y.o. male who presents to [place] at [facility] today for reoccurring R knee pain. Pt was last seen by Dr. Georgina Snell on 08/22/22 and his R knee Baker's cyst was aspirated and injection and was advised to use a compression sleeve. Today, pt reports  Dx imaging: 10/23/19 R knee MRI             06/14/10 R knee XR  Pertinent review of systems: ***  Relevant historical information: ***   Exam:  There were no vitals taken for this visit. General: Well Developed, well nourished, and in no acute distress.   MSK: ***    Lab and Radiology Results No results found for this or any previous visit (from the past 72 hour(s)). No results found.     Assessment and Plan: 37 y.o. male with ***   PDMP not reviewed this encounter. No orders of the defined types were placed in this encounter.  No orders of the defined types were placed in this encounter.    Discussed warning signs or symptoms. Please see discharge instructions. Patient expresses understanding.   ***

## 2023-06-06 NOTE — Progress Notes (Unsigned)
   Rubin Payor, PhD, LAT, ATC acting as a scribe for Clementeen Graham, MD.  Jorge Aldaco is a 37 y.o. male who presents to [facility] at [facility] today for cont'd R knee pain likely due to an OCD lesion in the knee, DJD and possible meniscus tear . Pt was last seen by Dr. Denyse Amass on 12/12/22 and was given a R knee steroid injection.   Today, pt reports ***  Dx imaging: 10/23/19 R knee MRI             06/14/10 R knee XR  Pertinent review of systems: ***  Relevant historical information: ***   Exam:  There were no vitals taken for this visit. General: Well Developed, well nourished, and in no acute distress.   MSK: ***    Lab and Radiology Results No results found for this or any previous visit (from the past 72 hour(s)). No results found.     Assessment and Plan: 37 y.o. male with ***   PDMP not reviewed this encounter. No orders of the defined types were placed in this encounter.  No orders of the defined types were placed in this encounter.    Discussed warning signs or symptoms. Please see discharge instructions. Patient expresses understanding.   ***

## 2023-06-07 ENCOUNTER — Ambulatory Visit: Payer: BC Managed Care – PPO | Admitting: Family Medicine

## 2023-06-07 ENCOUNTER — Other Ambulatory Visit: Payer: Self-pay

## 2023-06-07 ENCOUNTER — Encounter: Payer: Self-pay | Admitting: Family Medicine

## 2023-06-07 VITALS — BP 128/82 | HR 87 | Ht 69.0 in | Wt 215.0 lb

## 2023-06-07 DIAGNOSIS — M93261 Osteochondritis dissecans, right knee: Secondary | ICD-10-CM

## 2023-06-07 DIAGNOSIS — M7121 Synovial cyst of popliteal space [Baker], right knee: Secondary | ICD-10-CM

## 2023-06-07 DIAGNOSIS — M25561 Pain in right knee: Secondary | ICD-10-CM | POA: Diagnosis not present

## 2023-06-07 DIAGNOSIS — G8929 Other chronic pain: Secondary | ICD-10-CM | POA: Diagnosis not present

## 2023-06-07 NOTE — Patient Instructions (Addendum)
Thank you for coming in today.   You should hear from Dr Serena Croissant office soon.   Let me know if you have questions or concerns or want a second second opinion.

## 2023-06-15 ENCOUNTER — Ambulatory Visit (HOSPITAL_BASED_OUTPATIENT_CLINIC_OR_DEPARTMENT_OTHER): Payer: Self-pay | Admitting: Orthopaedic Surgery

## 2023-06-15 ENCOUNTER — Ambulatory Visit (INDEPENDENT_AMBULATORY_CARE_PROVIDER_SITE_OTHER): Payer: BC Managed Care – PPO | Admitting: Orthopaedic Surgery

## 2023-06-15 ENCOUNTER — Other Ambulatory Visit (HOSPITAL_BASED_OUTPATIENT_CLINIC_OR_DEPARTMENT_OTHER): Payer: Self-pay | Admitting: Orthopaedic Surgery

## 2023-06-15 ENCOUNTER — Ambulatory Visit (HOSPITAL_BASED_OUTPATIENT_CLINIC_OR_DEPARTMENT_OTHER): Payer: BC Managed Care – PPO

## 2023-06-15 DIAGNOSIS — M93261 Osteochondritis dissecans, right knee: Secondary | ICD-10-CM | POA: Diagnosis not present

## 2023-06-15 DIAGNOSIS — G8929 Other chronic pain: Secondary | ICD-10-CM

## 2023-06-15 DIAGNOSIS — M1711 Unilateral primary osteoarthritis, right knee: Secondary | ICD-10-CM

## 2023-06-15 NOTE — Progress Notes (Signed)
Chief Complaint: Right knee pain     History of Present Illness:    Jorge Lee is a 37 y.o. male presents today with ongoing treatment for the right knee persistent pain.  He has previously been seeing Dr. Katrinka Blazing as well as Dr. Denyse Amass for this.  In the past he did undergo a lateral collateral ligament reconstruction and lateral meniscal repair with pulmonology in 2019.  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 Katrinka Blazing.  He has had an injection with Dr. Denyse Amass as well which did give him approximately 1 week of relief.  He is an avid runner but unfortunately has had a very difficult time with positive crepitus on the inner part of the knee.  There is pain that is centered predominantly on the inner part of the knee.  He has been working with therapy as well as taking Tylenol and trying activity restriction without persistent relief.  At this point he is essentially not able to run.    Surgical History:   None  PMH/PSH/Family History/Social History/Meds/Allergies:    Past Medical History:  Diagnosis Date   Diabetes mellitus without complication (HCC)    No past surgical history on file. Social History   Socioeconomic History   Marital status: Married    Spouse name: Not on file   Number of children: Not on file   Years of education: Not on file   Highest education level: Not on file  Occupational History   Not on file  Tobacco Use   Smoking status: Never   Smokeless tobacco: Never  Substance and Sexual Activity   Alcohol use: Yes   Drug use: No   Sexual activity: Not on file  Other Topics Concern   Not on file  Social History Narrative   Not on file   Social Determinants of Health   Financial Resource Strain: Not on file  Food Insecurity: Not on file  Transportation Needs: Not on file  Physical Activity: Not on file   Stress: Not on file  Social Connections: Not on file   No family history on file. No Known Allergies Current Outpatient Medications  Medication Sig Dispense Refill   amphetamine-dextroamphetamine (ADDERALL) 20 MG tablet Take 10 mg by mouth 2 (two) times daily.  0   ibuprofen (ADVIL,MOTRIN) 200 MG tablet Take 200-400 mg by mouth every 6 (six) hours as needed for headache or mild pain.     Multiple Vitamins-Minerals (MULTIVITAMIN WITH MINERALS) tablet Take 1 tablet by mouth daily.     No current facility-administered medications for this visit.   No results found.  Review of Systems:   A ROS was performed including pertinent positives and negatives as documented in the HPI.  Physical Exam :   Constitutional: NAD and appears stated age Neurological: Alert and oriented Psych: Appropriate affect and cooperative There were no vitals taken for this visit.   Comprehensive Musculoskeletal Exam:      Musculoskeletal Exam  Gait Normal  Alignment Normal   Right Left  Inspection Normal Normal  Palpation    Tenderness Right medial knee none  Crepitus Positive None  Effusion Positive none  Range of Motion    Extension 0 -3  Flexion 135 135  Strength    Extension 5/5 5/5  Flexion 5/5 5/5  Ligament Exam     Generalized Laxity No No  Lachman Negative Negative   Pivot Shift Negative Negative  Anterior Drawer Negative Negative  Valgus at 0 Negative Negative  Valgus at 20 Negative Negative  Varus at 0 0 0  Varus at 20   0 0  Posterior Drawer at 90 0 0  Vascular/Lymphatic Exam    Edema None None  Venous Stasis Changes No No  Distal Circulation Normal Normal  Neurologic    Light Touch Sensation Intact Intact  Special Tests:      Imaging:   Xray (4 views right knee, limb length view): Advanced medial tibiofemoral osteoarthritis with varus malalignment and limb length.  Related to the arthritis   I personally reviewed and interpreted the radiographs.   Assessment:   37  y.o. male with right isolated medial tibiofemoral osteoarthritis.  I did describe that this is likely progression of his chondral injury.  Overall on examination he does have an intact lateral collateral ligament with no valgus laxity.  He does have varus malalignment due to his progressive osteoarthritis.  I did discuss that this is quite difficult given his young age of only 74.  That being said he has trialed strengthening as well as injections and aspirations but now is having persistent crepitus and inability to run.  I did discuss that given his age advanced osteoarthritis I would not recommend any type of chondral replacement or osteochondral allograft.  Specifically I do believe that he would benefit from a partial knee replacement medially in order to restore his alignment and resolve his osteoarthritis.  I do believe an MRI would be helpful to confirm no lateral patellofemoral involvement of the joint.  We did discuss that this would be a bridge to later on in life would almost certainly this would need to be revised.  I did discuss the impact that running would have gone on even a partial knee replacement.  After long discussion of his risks and options including the risks associated with partial knee replacement he has elected for right knee medial compartmental unicompartmental arthroplasty.  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   The patient  is aware of the material risks  and complications including, but not limited to injury to adjacent structures, neurovascular injury, infection, numbness, bleeding, implant failure, thermal burns, stiffness,  persistent pain, failure to heal, disease transmission from allograft, need for further surgery, dislocation, anesthetic risks, blood clots, risks of death,and others. The probabilities of surgical success and failure discussed with patient given their particular co-morbidities.The time and nature of expected rehabilitation and recovery was discussed.The patient's questions were all answered preoperatively.  No barriers to understanding were noted. I explained the natural history of the disease process and Rx rationale.  I explained to the patient what I considered to be reasonable expectations given their personal situation.  The final treatment plan was arrived at through a shared patient decision making process model.      I personally saw and evaluated the patient, and participated in the management and treatment plan.  Huel Cote, MD Attending  Physician, Orthopedic Surgery  This document was dictated using Dragon voice recognition software. A reasonable attempt at proof reading has been made to minimize errors.

## 2023-06-17 ENCOUNTER — Encounter: Payer: Self-pay | Admitting: Family Medicine

## 2023-06-17 ENCOUNTER — Encounter (HOSPITAL_BASED_OUTPATIENT_CLINIC_OR_DEPARTMENT_OTHER): Payer: Self-pay | Admitting: Orthopaedic Surgery

## 2023-06-19 ENCOUNTER — Telehealth: Payer: Self-pay | Admitting: Orthopaedic Surgery

## 2023-06-19 NOTE — Telephone Encounter (Signed)
Patient is requesting xrays from 06/15/23 on CD. Please copy to CD and call when ready. I advised patient to pick up at [facility], please remind him of that. And put note for patient to sign authorization. Thank you. Pts ph [phone]

## 2023-06-19 NOTE — Telephone Encounter (Signed)
Left voicemail advising patient CD was ready for pickup ?

## 2023-06-27 ENCOUNTER — Other Ambulatory Visit (HOSPITAL_BASED_OUTPATIENT_CLINIC_OR_DEPARTMENT_OTHER): Payer: Self-pay | Admitting: Orthopaedic Surgery

## 2023-06-27 DIAGNOSIS — M93261 Osteochondritis dissecans, right knee: Secondary | ICD-10-CM

## 2023-07-20 ENCOUNTER — Encounter (HOSPITAL_BASED_OUTPATIENT_CLINIC_OR_DEPARTMENT_OTHER): Payer: Self-pay | Admitting: Orthopaedic Surgery

## 2023-08-07 ENCOUNTER — Telehealth: Payer: Self-pay | Admitting: Orthopaedic Surgery

## 2023-08-07 NOTE — Telephone Encounter (Signed)
FMLA forms,auth & $25.00 cash received fom patient. To [employer].

## 2023-09-25 ENCOUNTER — Other Ambulatory Visit: Payer: Self-pay

## 2023-09-25 ENCOUNTER — Encounter (HOSPITAL_BASED_OUTPATIENT_CLINIC_OR_DEPARTMENT_OTHER): Payer: Self-pay | Admitting: Orthopaedic Surgery

## 2023-09-25 ENCOUNTER — Encounter (HOSPITAL_BASED_OUTPATIENT_CLINIC_OR_DEPARTMENT_OTHER)
Admission: RE | Admit: 2023-09-25 | Discharge: 2023-09-25 | Disposition: A | Discharge status: Home / Self Care | Payer: BC Managed Care – PPO | Source: Ambulatory Visit | Attending: Orthopaedic Surgery | Admitting: Orthopaedic Surgery

## 2023-09-25 DIAGNOSIS — Z01812 Encounter for preprocedural laboratory examination: Secondary | ICD-10-CM | POA: Diagnosis present

## 2023-09-25 LAB — SURGICAL PCR SCREEN
MRSA, PCR: NEGATIVE
Staphylococcus aureus: NEGATIVE

## 2023-10-02 NOTE — Anesthesia Preprocedure Evaluation (Signed)
Anesthesia Evaluation  Patient identified by MRN, date of birth, ID band Patient awake    Reviewed: Allergy & Precautions, NPO status , Patient's Chart, lab work & pertinent test results  History of Anesthesia Complications (+) PONV  Airway Mallampati: I  TM Distance: >3 FB Neck ROM: Full    Dental  (+) Dental Advisory Given   Pulmonary neg pulmonary ROS   breath sounds clear to auscultation       Cardiovascular negative cardio ROS  Rhythm:Regular Rate:Normal     Neuro/Psych  PSYCHIATRIC DISORDERS (ADHD)      narcolepsy negative neurological ROS     GI/Hepatic negative GI ROS, Neg liver ROS,,,  Endo/Other  negative endocrine ROS    Renal/GU negative Renal ROS     Musculoskeletal   Abdominal   Peds  Hematology negative hematology ROS (+)   Anesthesia Other Findings   Reproductive/Obstetrics                             Anesthesia Physical Anesthesia Plan  ASA: 2  Anesthesia Plan: General   Post-op Pain Management: Tylenol PO (pre-op)* and Regional block*   Induction: Intravenous  PONV Risk Score and Plan: 3 and Ondansetron, TIVA and Scopolamine patch - Pre-op  Airway Management Planned: LMA  Additional Equipment: None  Intra-op Plan:   Post-operative Plan:   Informed Consent: I have reviewed the patients History and Physical, chart, labs and discussed the procedure including the risks, benefits and alternatives for the proposed anesthesia with the patient or authorized representative who has indicated his/her understanding and acceptance.     Dental advisory given  Plan Discussed with: CRNA and Surgeon  Anesthesia Plan Comments: (Plan routine monitors, GA with adductor canal block for post op analgesia (Dr Steward Drone prefers GA for this pt))        Anesthesia Quick Evaluation

## 2023-10-03 ENCOUNTER — Ambulatory Visit: Payer: Self-pay | Admitting: Orthopaedic Surgery

## 2023-10-03 ENCOUNTER — Ambulatory Visit (HOSPITAL_BASED_OUTPATIENT_CLINIC_OR_DEPARTMENT_OTHER)
Admission: RE | Admit: 2023-10-03 | Discharge: 2023-10-03 | Disposition: A | Discharge status: Home / Self Care | Payer: BC Managed Care – PPO | Attending: Orthopaedic Surgery | Admitting: Orthopaedic Surgery

## 2023-10-03 ENCOUNTER — Ambulatory Visit (HOSPITAL_BASED_OUTPATIENT_CLINIC_OR_DEPARTMENT_OTHER): Payer: BC Managed Care – PPO | Admitting: Anesthesiology

## 2023-10-03 ENCOUNTER — Ambulatory Visit (HOSPITAL_BASED_OUTPATIENT_CLINIC_OR_DEPARTMENT_OTHER): Payer: Self-pay | Admitting: Anesthesiology

## 2023-10-03 ENCOUNTER — Encounter (HOSPITAL_BASED_OUTPATIENT_CLINIC_OR_DEPARTMENT_OTHER): Payer: Self-pay | Admitting: Orthopaedic Surgery

## 2023-10-03 ENCOUNTER — Encounter (HOSPITAL_BASED_OUTPATIENT_CLINIC_OR_DEPARTMENT_OTHER)
Admission: RE | Disposition: A | Discharge status: Home / Self Care | Payer: Self-pay | Source: Home / Self Care | Attending: Orthopaedic Surgery

## 2023-10-03 ENCOUNTER — Ambulatory Visit (HOSPITAL_COMMUNITY): Payer: BC Managed Care – PPO

## 2023-10-03 DIAGNOSIS — F909 Attention-deficit hyperactivity disorder, unspecified type: Secondary | ICD-10-CM | POA: Insufficient documentation

## 2023-10-03 DIAGNOSIS — Z01818 Encounter for other preprocedural examination: Secondary | ICD-10-CM

## 2023-10-03 DIAGNOSIS — M1711 Unilateral primary osteoarthritis, right knee: Secondary | ICD-10-CM

## 2023-10-03 HISTORY — DX: Narcolepsy without cataplexy: G47.419

## 2023-10-03 HISTORY — DX: Other specified postprocedural states: Z98.890

## 2023-10-03 HISTORY — DX: Attention-deficit hyperactivity disorder, unspecified type: F90.9

## 2023-10-03 HISTORY — DX: Other specified postprocedural states: R11.2

## 2023-10-03 HISTORY — PX: TOTAL KNEE ARTHROPLASTY: SHX125

## 2023-10-03 SURGERY — ARTHROPLASTY, KNEE, TOTAL
Anesthesia: Spinal | Site: Knee | Laterality: Right

## 2023-10-03 MED ORDER — PROPOFOL 10 MG/ML IV BOLUS
INTRAVENOUS | Status: AC
  Filled 2023-10-03: qty 20

## 2023-10-03 MED ORDER — LACTATED RINGERS IV SOLN
INTRAVENOUS | Status: DC
Start: 2023-10-03 — End: 2023-10-03

## 2023-10-03 MED ORDER — ACETAMINOPHEN 500 MG PO TABS
1000.0000 mg | ORAL_TABLET | Freq: Once | ORAL | Status: AC
  Administered 2023-10-03: 1000 mg via ORAL

## 2023-10-03 MED ORDER — SCOPOLAMINE 1 MG/3DAYS TD PT72
MEDICATED_PATCH | TRANSDERMAL | Status: AC
  Filled 2023-10-03: qty 1

## 2023-10-03 MED ORDER — BUPIVACAINE HCL (PF) 0.25 % IJ SOLN
INTRAMUSCULAR | Status: AC
  Filled 2023-10-03: qty 30

## 2023-10-03 MED ORDER — ACETAMINOPHEN 500 MG PO TABS
ORAL_TABLET | ORAL | Status: AC
  Filled 2023-10-03: qty 2

## 2023-10-03 MED ORDER — LIDOCAINE HCL (CARDIAC) PF 100 MG/5ML IV SOSY
PREFILLED_SYRINGE | INTRAVENOUS | Status: DC | PRN
  Administered 2023-10-03: 50 mg via INTRAVENOUS

## 2023-10-03 MED ORDER — HYDROMORPHONE HCL 1 MG/ML IJ SOLN
INTRAMUSCULAR | Status: AC
  Filled 2023-10-03: qty 0.5

## 2023-10-03 MED ORDER — HYDROMORPHONE HCL 1 MG/ML IJ SOLN
INTRAMUSCULAR | Status: DC | PRN
  Administered 2023-10-03: .5 mg via INTRAVENOUS

## 2023-10-03 MED ORDER — VANCOMYCIN HCL 1000 MG IV SOLR
INTRAVENOUS | Status: AC
  Filled 2023-10-03: qty 20

## 2023-10-03 MED ORDER — MIDAZOLAM HCL 2 MG/2ML IJ SOLN
INTRAMUSCULAR | Status: AC
Start: 2023-10-03 — End: ?
  Filled 2023-10-03: qty 2

## 2023-10-03 MED ORDER — KETAMINE HCL 10 MG/ML IJ SOLN
INTRAMUSCULAR | Status: DC | PRN
  Administered 2023-10-03: 20 mg via INTRAVENOUS

## 2023-10-03 MED ORDER — FENTANYL CITRATE (PF) 100 MCG/2ML IJ SOLN
INTRAMUSCULAR | Status: AC
  Filled 2023-10-03: qty 2

## 2023-10-03 MED ORDER — MIDAZOLAM HCL 2 MG/2ML IJ SOLN: 0.5000 mg | Freq: Once | INTRAMUSCULAR | Status: DC | PRN

## 2023-10-03 MED ORDER — LIDOCAINE 2% (20 MG/ML) 5 ML SYRINGE
INTRAMUSCULAR | Status: AC
  Filled 2023-10-03: qty 5

## 2023-10-03 MED ORDER — ASPIRIN 325 MG PO TBEC: 325.0000 mg | DELAYED_RELEASE_TABLET | Freq: Every day | ORAL | 0 refills | Status: AC

## 2023-10-03 MED ORDER — DEXMEDETOMIDINE HCL IN NACL 80 MCG/20ML IV SOLN
INTRAVENOUS | Status: DC | PRN
  Administered 2023-10-03: 8 ug via INTRAVENOUS

## 2023-10-03 MED ORDER — GABAPENTIN 300 MG PO CAPS
300.0000 mg | ORAL_CAPSULE | Freq: Once | ORAL | Status: AC
  Administered 2023-10-03: 300 mg via ORAL

## 2023-10-03 MED ORDER — OXYCODONE HCL 5 MG PO TABS: 5.0000 mg | ORAL_TABLET | ORAL | 0 refills | Status: DC | PRN

## 2023-10-03 MED ORDER — MEPERIDINE HCL 25 MG/ML IJ SOLN: 6.2500 mg | INTRAMUSCULAR | Status: DC | PRN

## 2023-10-03 MED ORDER — SODIUM CHLORIDE 0.9 % IR SOLN
Status: DC | PRN
  Administered 2023-10-03: 500 mL

## 2023-10-03 MED ORDER — HYDROMORPHONE HCL 1 MG/ML IJ SOLN
0.2500 mg | INTRAMUSCULAR | Status: DC | PRN
  Administered 2023-10-03 (×2): 0.25 mg via INTRAVENOUS
  Administered 2023-10-03 (×2): 0.5 mg via INTRAVENOUS

## 2023-10-03 MED ORDER — OXYCODONE HCL 5 MG PO TABS
5.0000 mg | ORAL_TABLET | Freq: Once | ORAL | Status: AC | PRN
Start: 2023-10-03 — End: 2023-10-03
  Administered 2023-10-03: 5 mg via ORAL

## 2023-10-03 MED ORDER — DEXAMETHASONE SODIUM PHOSPHATE 4 MG/ML IJ SOLN
INTRAMUSCULAR | Status: DC | PRN
  Administered 2023-10-03: 8 mg via INTRAVENOUS

## 2023-10-03 MED ORDER — FENTANYL CITRATE (PF) 100 MCG/2ML IJ SOLN
100.0000 ug | Freq: Once | INTRAMUSCULAR | Status: AC
  Administered 2023-10-03: 100 ug via INTRAVENOUS

## 2023-10-03 MED ORDER — PROPOFOL 10 MG/ML IV BOLUS
INTRAVENOUS | Status: AC
Start: 2023-10-03 — End: ?
  Filled 2023-10-03: qty 20

## 2023-10-03 MED ORDER — PROPOFOL 10 MG/ML IV BOLUS
INTRAVENOUS | Status: DC | PRN
  Administered 2023-10-03: 200 mg via INTRAVENOUS

## 2023-10-03 MED ORDER — OXYCODONE HCL 5 MG PO TABS
ORAL_TABLET | ORAL | Status: AC
  Filled 2023-10-03: qty 1

## 2023-10-03 MED ORDER — TRANEXAMIC ACID-NACL 1000-0.7 MG/100ML-% IV SOLN
1000.0000 mg | INTRAVENOUS | Status: AC
Start: 2023-10-03 — End: 2023-10-03
  Administered 2023-10-03: 1000 mg via INTRAVENOUS

## 2023-10-03 MED ORDER — CEFAZOLIN SODIUM-DEXTROSE 2-4 GM/100ML-% IV SOLN
INTRAVENOUS | Status: AC
  Filled 2023-10-03: qty 100

## 2023-10-03 MED ORDER — TRANEXAMIC ACID-NACL 1000-0.7 MG/100ML-% IV SOLN
INTRAVENOUS | Status: AC
  Filled 2023-10-03: qty 100

## 2023-10-03 MED ORDER — ACETAMINOPHEN 500 MG PO TABS: 500.0000 mg | ORAL_TABLET | Freq: Three times a day (TID) | ORAL | 0 refills | Status: AC

## 2023-10-03 MED ORDER — OXYCODONE HCL 5 MG/5ML PO SOLN: 5.0000 mg | Freq: Once | ORAL | Status: AC | PRN

## 2023-10-03 MED ORDER — PROPOFOL 500 MG/50ML IV EMUL
INTRAVENOUS | Status: DC | PRN
  Administered 2023-10-03: 150 ug/kg/min via INTRAVENOUS

## 2023-10-03 MED ORDER — FENTANYL CITRATE (PF) 100 MCG/2ML IJ SOLN
INTRAMUSCULAR | Status: DC | PRN
  Administered 2023-10-03 (×2): 50 ug via INTRAVENOUS

## 2023-10-03 MED ORDER — CEFAZOLIN SODIUM-DEXTROSE 2-4 GM/100ML-% IV SOLN
2.0000 g | INTRAVENOUS | Status: AC
  Administered 2023-10-03: 2 g via INTRAVENOUS

## 2023-10-03 MED ORDER — SCOPOLAMINE 1 MG/3DAYS TD PT72
1.0000 | MEDICATED_PATCH | TRANSDERMAL | Status: DC
  Administered 2023-10-03: 1.5 mg via TRANSDERMAL

## 2023-10-03 MED ORDER — ONDANSETRON HCL 4 MG/2ML IJ SOLN
INTRAMUSCULAR | Status: DC | PRN
  Administered 2023-10-03: 4 mg via INTRAVENOUS

## 2023-10-03 MED ORDER — DEXAMETHASONE SODIUM PHOSPHATE 10 MG/ML IJ SOLN
INTRAMUSCULAR | Status: AC
  Filled 2023-10-03: qty 1

## 2023-10-03 MED ORDER — ROPIVACAINE HCL 7.5 MG/ML IJ SOLN
INTRAMUSCULAR | Status: DC | PRN
  Administered 2023-10-03: 20 mL via PERINEURAL

## 2023-10-03 MED ORDER — IBUPROFEN 800 MG PO TABS: 800.0000 mg | ORAL_TABLET | Freq: Three times a day (TID) | ORAL | 0 refills | Status: AC

## 2023-10-03 MED ORDER — 0.9 % SODIUM CHLORIDE (POUR BTL) OPTIME
TOPICAL | Status: DC | PRN
  Administered 2023-10-03: 500 mL

## 2023-10-03 MED ORDER — MIDAZOLAM HCL 5 MG/5ML IJ SOLN
INTRAMUSCULAR | Status: DC | PRN
  Administered 2023-10-03 (×2): 1 mg via INTRAVENOUS

## 2023-10-03 MED ORDER — KETAMINE HCL 50 MG/5ML IJ SOSY
PREFILLED_SYRINGE | INTRAMUSCULAR | Status: AC
  Filled 2023-10-03: qty 5

## 2023-10-03 MED ORDER — MIDAZOLAM HCL 2 MG/2ML IJ SOLN
INTRAMUSCULAR | Status: AC
  Filled 2023-10-03: qty 2

## 2023-10-03 MED ORDER — GABAPENTIN 300 MG PO CAPS
ORAL_CAPSULE | ORAL | Status: AC
  Filled 2023-10-03: qty 1

## 2023-10-03 MED ORDER — VANCOMYCIN HCL 1000 MG IV SOLR
INTRAVENOUS | Status: DC | PRN
  Administered 2023-10-03: 1000 mg via TOPICAL

## 2023-10-03 MED ORDER — ACETAMINOPHEN 500 MG PO TABS: 1000.0000 mg | ORAL_TABLET | Freq: Once | ORAL | Status: AC

## 2023-10-03 MED ORDER — ONDANSETRON HCL 4 MG/2ML IJ SOLN
INTRAMUSCULAR | Status: AC
  Filled 2023-10-03: qty 2

## 2023-10-03 MED ORDER — DEXMEDETOMIDINE HCL IN NACL 80 MCG/20ML IV SOLN
INTRAVENOUS | Status: AC
  Filled 2023-10-03: qty 20

## 2023-10-03 MED ORDER — MIDAZOLAM HCL 2 MG/2ML IJ SOLN
2.0000 mg | Freq: Once | INTRAMUSCULAR | Status: AC
  Administered 2023-10-03: 2 mg via INTRAVENOUS

## 2023-10-03 SURGICAL SUPPLY — 56 items
BANDAGE ESMARK 6X9 LF (GAUZE/BANDAGES/DRESSINGS) IMPLANT
BEARING TIBIAL IBALANCE SZ5 8M (Miscellaneous) IMPLANT
BLADE HEX COATED 2.75 (ELECTRODE) ×1 IMPLANT
BLADE SAG 18X100X1.27 (BLADE) ×1 IMPLANT
BLADE SAW RECIP 87.9 MT (BLADE) ×1 IMPLANT
BNDG ESMARK 6X9 LF (GAUZE/BANDAGES/DRESSINGS)
BOWL SMART MIX CTS (DISPOSABLE) ×1 IMPLANT
CEMENT BONE SIMPLEX SPEEDSET (Cement) IMPLANT
CHLORAPREP W/TINT 26 (MISCELLANEOUS) ×1 IMPLANT
CLSR STERI-STRIP ANTIMIC 1/2X4 (GAUZE/BANDAGES/DRESSINGS) IMPLANT
COMP FEM CEMENT IBALANCE SZ5 (Knees) ×1 IMPLANT
COMPONENT FEM CMNT IBALNCE SZ5 (Knees) IMPLANT
COOLER ICEMAN CLASSIC (MISCELLANEOUS) ×1 IMPLANT
COVER BACK TABLE 60X90IN (DRAPES) ×1 IMPLANT
CUFF TOURN SGL QUICK 34 (TOURNIQUET CUFF) ×1
CUFF TOURN SGL QUICK 42 (TOURNIQUET CUFF) IMPLANT
CUFF TRNQT CYL 34X4.125X (TOURNIQUET CUFF) ×1 IMPLANT
DERMABOND ADVANCED .7 DNX12 (GAUZE/BANDAGES/DRESSINGS) IMPLANT
DRAPE EXTREMITY T 121X128X90 (DISPOSABLE) ×1 IMPLANT
DRAPE INCISE IOBAN 66X45 STRL (DRAPES) IMPLANT
DRAPE U-SHAPE 47X51 STRL (DRAPES) ×1 IMPLANT
DRSG AQUACEL AG ADV 3.5X 6 (GAUZE/BANDAGES/DRESSINGS) IMPLANT
DRSG AQUACEL AG ADV 3.5X10 (GAUZE/BANDAGES/DRESSINGS) IMPLANT
ELECT REM PT RETURN 9FT ADLT (ELECTROSURGICAL) ×1
ELECTRODE REM PT RTRN 9FT ADLT (ELECTROSURGICAL) ×1 IMPLANT
GAUZE PAD ABD 8X10 STRL (GAUZE/BANDAGES/DRESSINGS) IMPLANT
GAUZE SPONGE 4X4 12PLY STRL (GAUZE/BANDAGES/DRESSINGS) ×1 IMPLANT
GAUZE XEROFORM 1X8 LF (GAUZE/BANDAGES/DRESSINGS) IMPLANT
GLOVE BIO SURGEON STRL SZ 6 (GLOVE) ×1 IMPLANT
GLOVE BIO SURGEON STRL SZ7.5 (GLOVE) ×1 IMPLANT
GLOVE BIOGEL PI IND STRL 6.5 (GLOVE) ×1 IMPLANT
GLOVE BIOGEL PI IND STRL 8 (GLOVE) ×1 IMPLANT
GOWN STRL REUS W/ TWL LRG LVL3 (GOWN DISPOSABLE) ×2 IMPLANT
GOWN STRL REUS W/ TWL XL LVL3 (GOWN DISPOSABLE) ×1 IMPLANT
GOWN STRL REUS W/TWL LRG LVL3 (GOWN DISPOSABLE) ×2
GOWN STRL REUS W/TWL XL LVL3 (GOWN DISPOSABLE) ×1
HANDPIECE INTERPULSE COAX TIP (DISPOSABLE) ×1
KIT TURNOVER KIT B (KITS) ×1 IMPLANT
MANIFOLD NEPTUNE II (INSTRUMENTS) ×1 IMPLANT
NS IRRIG 1000ML POUR BTL (IV SOLUTION) ×1 IMPLANT
PACK BASIN DAY SURGERY FS (CUSTOM PROCEDURE TRAY) ×1 IMPLANT
PACK TOTAL JOINT (CUSTOM PROCEDURE TRAY) ×1 IMPLANT
PAD CAST 4YDX4 CTTN HI CHSV (CAST SUPPLIES) ×1 IMPLANT
PAD COLD SHLDR WRAP-ON (PAD) ×1 IMPLANT
SET HNDPC FAN SPRY TIP SCT (DISPOSABLE) ×1 IMPLANT
SLEEVE SCD COMPRESS KNEE MED (STOCKING) ×1 IMPLANT
SUCTION TUBE FRAZIER 10FR DISP (SUCTIONS) ×1 IMPLANT
SUT ETHILON 3 0 PS 1 (SUTURE) IMPLANT
SUT MNCRL AB 3-0 PS2 27 (SUTURE) IMPLANT
SUT VIC AB 0 CT1 27XBRD ANBCTR (SUTURE) ×2 IMPLANT
SUT VIC AB 2-0 CT1 TAPERPNT 27 (SUTURE) ×2 IMPLANT
SUT VIC AB CT1 27XBRD ANBCTRL (SUTURE) ×2
TOWEL GREEN STERILE FF (TOWEL DISPOSABLE) ×4 IMPLANT
TRAY FOLEY W/BAG SLVR 14FR LF (SET/KITS/TRAYS/PACK) IMPLANT
TRAY TIB UKA CMNTD SZ5 RM/LL (Miscellaneous) IMPLANT
TUBE SUCTION HIGH CAP CLEAR NV (SUCTIONS) ×1 IMPLANT

## 2023-10-03 NOTE — Discharge Instructions (Addendum)
Discharge Instructions    Attending Surgeon: Huel Cote, MD Office Phone Number: [phone]   Diagnosis and Procedures:    Surgeries Performed: Right knee partial knee replacement  Discharge Plan:    Diet: Resume usual diet. Begin with light or bland foods.  Drink plenty of fluids.  Activity:  Weight bearing as tolerated right leg. You are advised to go home directly from the hospital or surgical center. Restrict your activities.  GENERAL INSTRUCTIONS: 1.  Please apply ice to your wound to help with swelling and inflammation. This will improve your comfort and your overall recovery following surgery.     2. Please call Dr. Serena Croissant office at [phone] with questions Monday-Friday during business hours. If no one answers, please leave a message and someone should get back to the patient within 24 hours. For emergencies please call 911 or proceed to the emergency room.   3. Patient to notify surgical team if experiences any of the following: Bowel/Bladder dysfunction, uncontrolled pain, nerve/muscle weakness, incision with increased drainage or redness, nausea/vomiting and Fever greater than 101.0 F.  Be alert for signs of infection including redness, streaking, odor, fever or chills. Be alert for excessive pain or bleeding and notify your surgeon immediately.  WOUND INSTRUCTIONS:   Leave your dressing, cast, or splint in place until your post operative visit.  Keep it clean and dry.  Always keep the incision clean and dry until the staples/sutures are removed. If there is no drainage from the incision you should keep it open to air. If there is drainage from the incision you must keep it covered at all times until the drainage stops  Do not soak in a bath tub, hot tub, pool, lake or other body of water until 21 days after your surgery and your incision is completely dry and healed.  If you have removable sutures (or staples) they must be removed 10-14 days (unless  otherwise instructed) from the day of your surgery.     1)  Elevate the extremity as much as possible.  2)  Keep the dressing clean and dry.  3)  Please call us if the dressing becomes wet or dirty.  4)  If you are experiencing worsening pain or worsening swelling, please call.     MEDICATIONS: Resume all previous home medications at the previous prescribed dose and frequency unless otherwise noted Start taking the  pain medications on an as-needed basis as prescribed  Please taper down pain medication over the next week following surgery.  Ideally you should not require a refill of any narcotic pain medication.  Take pain medication with food to minimize nausea. In addition to the prescribed pain medication, you may take over-the-counter pain relievers such as Tylenol.  Do NOT take additional tylenol if your pain medication already has tylenol in it.  Aspirin 325mg  daily per instructions on bottle. Narcotic policy: Per [facility] clinic policy, our goal is ensure optimal postoperative pain control with a multimodal pain management strategy. For all [facility] patients, our goal is to wean post-operative narcotic medications by 6 weeks post-operatively, and many times sooner. If this is not possible due to utilization of pain medication prior to surgery, your [facility] doctor will support your acute post-operative pain control for the first 6 weeks postoperatively, with a plan to transition you back to your primary pain team following that. Cyndia Skeeters will work to ensure a Therapist, occupational.       FOLLOWUP INSTRUCTIONS: 1. Follow up at the Physical  Therapy Clinic 3-4 days following surgery. This appointment should be scheduled unless other arrangements have been made.The Physical Therapy scheduling number is [phone] if an appointment has not already been arranged.  2. Contact Dr. Serena Croissant office during office hours at [phone] or the practice after hours line at [phone] for  non-emergencies. For medical emergencies call 911.   Discharge Location: Home   May take Tylenol after 12:40 pm, if needed.    Post Anesthesia Home Care Instructions  Activity: Get plenty of rest for the remainder of the day. A responsible individual must stay with you for 24 hours following the procedure.  For the next 24 hours, DO NOT: -Drive a car -Advertising copywriter -Drink alcoholic beverages -Take any medication unless instructed by your physician -Make any legal decisions or sign important papers.  Meals: Start with liquid foods such as gelatin or soup. Progress to regular foods as tolerated. Avoid greasy, spicy, heavy foods. If nausea and/or vomiting occur, drink only clear liquids until the nausea and/or vomiting subsides. Call your physician if vomiting continues.  Special Instructions/Symptoms: Your throat may feel dry or sore from the anesthesia or the breathing tube placed in your throat during surgery. If this causes discomfort, gargle with warm salt water. The discomfort should disappear within 24 hours.  If you had a scopolamine patch placed behind your ear for the management of post- operative nausea and/or vomiting:  1. The medication in the patch is effective for 72 hours, after which it should be removed.  Wrap patch in a tissue and discard in the trash. Wash hands thoroughly with soap and water. 2. You may remove the patch earlier than 72 hours if you experience unpleasant side effects which may include dry mouth, dizziness or visual disturbances. 3. Avoid touching the patch. Wash your hands with soap and water after contact with the patch.     Regional Anesthesia Blocks  1. You may not be able to move or feel the "blocked" extremity after a regional anesthetic block. This may last may last from 3-48 hours after placement, but it will go away. The length of time depends on the medication injected and your individual response to the medication. As the nerves start  to wake up, you may experience tingling as the movement and feeling returns to your extremity. If the numbness and inability to move your extremity has not gone away after 48 hours, please call your surgeon.   2. The extremity that is blocked will need to be protected until the numbness is gone and the strength has returned. Because you cannot feel it, you will need to take extra care to avoid injury. Because it may be weak, you may have difficulty moving it or using it. You may not know what position it is in without looking at it while the block is in effect.  3. For blocks in the legs and feet, returning to weight bearing and walking needs to be done carefully. You will need to wait until the numbness is entirely gone and the strength has returned. You should be able to move your leg and foot normally before you try and bear weight or walk. You will need someone to be with you when you first try to ensure you do not fall and possibly risk injury.  4. Bruising and tenderness at the needle site are common side effects and will resolve in a few days.  5. Persistent numbness or new problems with movement should be communicated to the surgeon  or the [facility] [phone] [facility] [phone]).

## 2023-10-03 NOTE — Op Note (Signed)
Date of Surgery: 10/03/2023  INDICATIONS: Mr. Jorge Lee is a 37 y.o.-year-old male with right knee medial osteoarthritis.  The risk and benefits of the procedure were discussed in detail and documented in the pre-operative evaluation.   PREOPERATIVE DIAGNOSIS: 1. Right knee medial compartment osteoarthritis  POSTOPERATIVE DIAGNOSIS: Same.  PROCEDURE: 1. Right knee medial unicompartmental knee arthroplasty  SURGEON: Benancio Deeds MD  ASSISTANT: Kerby Less, ATC  ANESTHESIA:  general  IV FLUIDS AND URINE: See anesthesia record.  ANTIBIOTICS: Ancef  ESTIMATED BLOOD LOSS: 50 mL.  IMPLANTS:  Implant Name Type Inv. Item Serial No. Manufacturer Lot No. LRB No. Used Action  CEMENT BONE SIMPLEX SPEEDSET - [id number] Cement CEMENT BONE SIMPLEX SPEEDSET  [employer] [id number] Right 2 Implanted  IBALANCE UKA TIBIAL CEMENTED TRAY SIZE 5   [id number]  [id number] Right 1 Implanted  IBALANCE UKA FEMORAL CEMENTED SIZE 5 RIGHT MEDIAL   [id number]  [id number] Right 1 Implanted  IBALANCE UKA TIBIAL BEARING IMPLANT   [id number]  [id number] Right 1 Implanted    DRAINS: None  CULTURES: None  COMPLICATIONS: none  DESCRIPTION OF PROCEDURE:  The patient was identified in the pre-operative holding area, marked, and consent completed.  Anesthesia was then performed with regional nerve block.  The patient was transferred to the operative suite and placed in the supine position with all bony prominences padded. SCDs were placed on the non-operative lower extremity. Appropriate antibiotics was administered within 1 hour before incision. The patient underwent general anesthesia with the anesthesia team. The right lower extremity was then prepped and draped in standard fashion.A time out was performed confirming the correct extremity, correct patient and correct procedures.   An anterior midline incision was marked and made just medial to the midline from just above the patella down to the tibial  tubercle. Full-thickness flaps were raised and hemostasis maintained using electrocautery. We then performed a medial parapatellar arthrotomy taking care to preserve the parts of the joint that would not be altered, including ligaments and patellofemoral joint. We excised the fat pad and performed a medial peel on the tibia, noting grade IV changes on the femoral condyle and tibia. Otherwise the patella and remaining articular cartilage demonstrated minimal degenerative wear and both the ACL and PCL were functioning and intact. The medial meniscus was resected at this point.  We then turned our attention to the tibia, placing the extramedullary reference guide in the appropriate position and adjusted it so that the appropriate amount of bone was resected at an angle recreating native slope. We then performed the cuts with a sagittal saw vertically at the medial spine, preserving ACL and PCL, followed by the transverse cut with the oscillating saw. Once we were happy with the cut we checked the gap sizing with a size 9mm block in flexion and extension and noted it to be appropriate.  We then turned our attention to the femur, setting up the distal femoral resection guide on the spacing block in extension, securing it with pins. The distal femoral cut was cut performed while protecting the notch and skin with retractors. Lastly the knee was flexed and the posterior resection guide was secured with pins and the cut was performed. We then used osteotomes and rongeurs to complete and finish all cuts. We then re-checked our gap balancing and noted it to be appropriate once more. We then sized the distal femur and then secured this with pins and cut the posterior chamfer and drilling the lugs.We next sized the tibia using sizing  rings and then trialed all components with the 9mm insert. The knee was brought though its range of motion and found to be satisfactory in flexion/extension. We then removed the trials and  inserted the guide to finalize the tibia with drill and keel, this was performed without complication.  We then irrigated all bony surfaces with pulse suction irrigator and dried them, and inspected cuts and soft tissues, noting that all were appropriate and ready for implantation. We then proceeded with mixing cement and placing a 4x8 sponge at the posterior aspect of the knee, held in place by a Z-retractor to prevent excess cement from passing in that direction.Once the cement was appropriately tacky we placed the appropriate amount on the tibial implant and placed the implant in the tibia, keeping it compressed and noting that it was sitting in the same position as previously trialed. Excess cement was removed and we irrigated and dried the femoral bone and mixed a new batch of cement. Once it was appropriately tacky we applied it to the femoral component and inserted the component, removed excess cement, and then inserted the polyethylene insert and extended the knee fully, compressing the knee while the cement hardened. We then inspected the knee and irrigated it, removing all excess cement and ranging it fully. We were happy with the motion of the knee, flexing to >130 and stable throughout. We then closed the arthrotomy with 0-vicryl, followed by 2-0 vicryl buried stitches at the skin and then skin staples. We cleaned and dried the wound, applied sterile dressings and let down the tourniquet.All counts were correct at the end of the case.The patient awoke from anesthesia without difficulty and was transferred to the PACU in stable condition.    POSTOPERATIVE PLAN: He will be weight bearing as tolerated right leg. He will be placed on 4 weeks of Aspirin 325 for DVT prophylaxis. He will be seen in 2 weeks for suture removal.  Benancio Deeds, MD 9:50 AM

## 2023-10-03 NOTE — Anesthesia Procedure Notes (Signed)
Anesthesia Regional Block: Adductor canal block   Pre-Anesthetic Checklist: , timeout performed,  Correct Patient, Correct Site, Correct Laterality,  Correct Procedure, Correct Position, site marked,  Risks and benefits discussed,  Surgical consent,  Pre-op evaluation,  At surgeon's request and post-op pain management  Laterality: Right and Lower  Prep: chloraprep       Needles:  Injection technique: Single-shot  Needle Type: Echogenic Needle     Needle Length: 9cm  Needle Gauge: 21     Additional Needles:   Procedures:,,,, ultrasound used (permanent image in chart),,    Narrative:  Start time: 10/03/2023 7:10 AM End time: 10/03/2023 7:16 AM Injection made incrementally with aspirations every 5 mL.  Performed by: Personally  Anesthesiologist: Jairo Ben, MD  Additional Notes: Pt identified in Holding room.  Monitors applied. Working IV access confirmed. Timeout, Sterile prep R thigh.  #21ga ECHOgenic Arrow block needle into adductor canal with US guidance.  20cc 0.75% Ropivacaine injected incrementally after negative test dose.  Patient asymptomatic, VSS, no heme aspirated, tolerated well.   Sandford Craze, MD

## 2023-10-03 NOTE — Transfer of Care (Signed)
Immediate Anesthesia Transfer of Care Note  Patient: Lysander Pendley  Procedure(s) Performed: RIGHT  KNEE UNI ARTHROPLASTY (Right: Knee)  Patient Location: PACU  Anesthesia Type:General  Level of Consciousness: awake, alert , and oriented  Airway & Oxygen Therapy: Patient Spontanous Breathing and Patient connected to face mask oxygen  Post-op Assessment: Report given to RN and Post -op Vital signs reviewed and stable  Post vital signs: Reviewed and stable  Last Vitals:  Vitals Value Taken Time  BP 103/65 10/03/23 1004  Temp 36.2 C 10/03/23 1002  Pulse 69 10/03/23 1010  Resp 23 10/03/23 1010  SpO2 94 % 10/03/23 1010  Vitals shown include unfiled device data.  Last Pain:  Vitals:   10/03/23 0635  TempSrc: Temporal  PainSc: 3       Patients Stated Pain Goal: 0 (10/03/23 9604)  Complications: No notable events documented.

## 2023-10-03 NOTE — H&P (Signed)
Chief Complaint: Right knee pain        History of Present Illness:      Jorge Lee is a 37 y.o. male presents today with ongoing treatment for the right knee persistent pain.  He has previously been seeing Dr. Katrinka Blazing as well as Dr. Denyse Amass for this.  In the past he did undergo a lateral collateral ligament reconstruction and lateral meniscal repair with pulmonology in 2019.  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 Katrinka Blazing.  He has had an injection with Dr. Denyse Amass as well which did give him approximately 1 week of relief.  He is an avid runner but unfortunately has had a very difficult time with positive crepitus on the inner part of the knee.  There is pain that is centered predominantly on the inner part of the knee.  He has been working with therapy as well as taking Tylenol and trying activity restriction without persistent relief.  At this point he is essentially not able to run.       Surgical History:   None   PMH/PSH/Family History/Social History/Meds/Allergies:         Past Medical History:  Diagnosis Date   Diabetes mellitus without complication (HCC)          No past surgical history on file.     Social History         Socioeconomic History   Marital status: Married      Spouse name: Not on file   Number of children: Not on file   Years of education: Not on file   Highest education level: Not on file  Occupational History   Not on file  Tobacco Use   Smoking status: Never   Smokeless tobacco: Never  Substance and Sexual Activity   Alcohol use: Yes   Drug use: No   Sexual activity: Not on file  Other Topics Concern   Not on file  Social History Narrative   Not on file    Social Determinants of Health    Financial Resource Strain: Not on file  Food Insecurity: Not on file  Transportation Needs: Not on  file  Physical Activity: Not on file  Stress: Not on file  Social Connections: Not on file    No family history on file.     Allergies  No Known Allergies         Current Outpatient Medications  Medication Sig Dispense Refill   amphetamine-dextroamphetamine (ADDERALL) 20 MG tablet Take 10 mg by mouth 2 (two) times daily.   0   ibuprofen (ADVIL,MOTRIN) 200 MG tablet Take 200-400 mg by mouth every 6 (six) hours as needed for headache or mild pain.       Multiple Vitamins-Minerals (MULTIVITAMIN WITH MINERALS) tablet Take 1 tablet by mouth daily.          No current facility-administered medications for this visit.      Imaging Results (Last 48 hours)  No results found.     Review of Systems:   A ROS was performed including pertinent positives and negatives as documented in the HPI.   Physical Exam :   Constitutional: NAD and appears stated age Neurological: Alert and  oriented Psych: Appropriate affect and cooperative There were no vitals taken for this visit.    Comprehensive Musculoskeletal Exam:           Musculoskeletal Exam  Gait Normal  Alignment Normal    Right Left  Inspection Normal Normal  Palpation      Tenderness Right medial knee none  Crepitus Positive None  Effusion Positive none  Range of Motion      Extension 0 -3  Flexion 135 135  Strength      Extension 5/5 5/5  Flexion 5/5 5/5  Ligament Exam        Generalized Laxity No No  Lachman Negative Negative   Pivot Shift Negative Negative  Anterior Drawer Negative Negative  Valgus at 0 Negative Negative  Valgus at 20 Negative Negative  Varus at 0 0 0  Varus at 20   0 0  Posterior Drawer at 90 0 0  Vascular/Lymphatic Exam      Edema None None  Venous Stasis Changes No No  Distal Circulation Normal Normal  Neurologic      Light Touch Sensation Intact Intact  Special Tests:         Imaging:   Xray (4 views right knee, limb length view): Advanced medial tibiofemoral osteoarthritis with  varus malalignment and limb length.  Related to the arthritis     I personally reviewed and interpreted the radiographs.     Assessment:   37 y.o. male with right isolated medial tibiofemoral osteoarthritis.  I did describe that this is likely progression of his chondral injury.  Overall on examination he does have an intact lateral collateral ligament with no valgus laxity.  He does have varus malalignment due to his progressive osteoarthritis.  I did discuss that this is quite difficult given his young age of only 61.  That being said he has trialed strengthening as well as injections and aspirations but now is having persistent crepitus and inability to run.  I did discuss that given his age advanced osteoarthritis I would not recommend any type of chondral replacement or osteochondral allograft.  Specifically I do believe that he would benefit from a partial knee replacement medially in order to restore his alignment and resolve his osteoarthritis.  I do believe an MRI would be helpful to confirm no lateral patellofemoral involvement of the joint.  We did discuss that this would be a bridge to later on in life would almost certainly this would need to be revised.  I did discuss the impact that running would have gone on even a partial knee replacement.  After long discussion of his risks and options including the risks associated with partial knee replacement he has elected for right knee medial compartmental unicompartmental arthroplasty.  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    The patient  is aware of the material risks  and complications  including, but not limited to injury to adjacent structures, neurovascular injury, infection, numbness, bleeding, implant failure, thermal burns, stiffness, persistent pain, failure to heal, disease transmission from allograft, need for further surgery, dislocation, anesthetic risks, blood clots, risks of death,and others. The probabilities  of surgical success and failure discussed with patient given their particular co-morbidities.The time and nature of expected rehabilitation and recovery was discussed.The patient's questions were all answered preoperatively.  No barriers to understanding were noted. I explained the natural history of the disease process and Rx rationale.  I explained to the patient what I considered to be reasonable expectations given their personal situation.  The final treatment plan was arrived at through a shared patient decision making process model.           I personally saw and evaluated the patient, and participated in the management and treatment plan.   Huel Cote, MD Attending Physician, Orthopedic Surgery   This document was dictated using Dragon voice recognition software. A reasonable attempt at proof reading has been made to minimize errors.

## 2023-10-03 NOTE — Brief Op Note (Signed)
   Brief Op Note  Date of Surgery: 10/03/2023  Preoperative Diagnosis: RIGHT KNEE OSTEOARTHRITIS  Postoperative Diagnosis: same  Procedure: Procedure(s): RIGHT  KNEE UNI ARTHROPLASTY  Implants: Implant Name Type Inv. Item Serial No. Manufacturer Lot No. LRB No. Used Action  CEMENT BONE SIMPLEX SPEEDSET - [id number] Cement CEMENT BONE SIMPLEX SPEEDSET  [employer] [id number] Right 2 Implanted  IBALANCE UKA TIBIAL CEMENTED TRAY SIZE 5   [id number]  [id number] Right 1 Implanted  IBALANCE UKA FEMORAL CEMENTED SIZE 5 RIGHT MEDIAL   [id number]  [id number] Right 1 Implanted  [employer] TIBIAL BEARING IMPLANT   [id number]  [id number] Right 1 Implanted    Surgeons: Surgeon(s): Huel Cote, MD  Anesthesia: General    Estimated Blood Loss: See anesthesia record  Complications: None  Condition to PACU: Stable  Benancio Deeds, MD 10/03/2023 9:50 AM

## 2023-10-03 NOTE — Anesthesia Postprocedure Evaluation (Signed)
Anesthesia Post Note  Patient: Jorge Lee  Procedure(s) Performed: RIGHT  KNEE UNI ARTHROPLASTY (Right: Knee)     Patient location during evaluation: PACU Anesthesia Type: Spinal Level of consciousness: awake and alert, patient cooperative and oriented Pain management: pain level controlled Vital Signs Assessment: post-procedure vital signs reviewed and stable Respiratory status: spontaneous breathing, nonlabored ventilation and respiratory function stable Cardiovascular status: blood pressure returned to baseline and stable Postop Assessment: no apparent nausea or vomiting and adequate PO intake Anesthetic complications: no   No notable events documented.  Last Vitals:  Vitals:   10/03/23 1145 10/03/23 1217  BP: 111/79 122/64  Pulse: 75 68  Resp: (!) 9 20  Temp:  36.6 C  SpO2: 95% 96%    Last Pain:  Vitals:   10/03/23 1217  TempSrc: Temporal  PainSc: 3         RLE Motor Response: Purposeful movement (10/03/23 1215) RLE Sensation: Full sensation (10/03/23 1215)      Erling Cruz. Teela Narducci

## 2023-10-03 NOTE — Interval H&P Note (Signed)
History and Physical Interval Note:  10/03/2023 6:52 AM  Jorge Lee  has presented today for surgery, with the diagnosis of RIGHT KNEE OSTEOARTHRITIS.  The various methods of treatment have been discussed with the patient and family. After consideration of risks, benefits and other options for treatment, the patient has consented to  Procedure(s): RIGHT  KNEE UNI ARTHROPLASTY (Right) as a surgical intervention.  The patient's history has been reviewed, patient examined, no change in status, stable for surgery.  I have reviewed the patient's chart and labs.  Questions were answered to the patient's satisfaction.     Huel Cote

## 2023-10-03 NOTE — Progress Notes (Signed)
Assisted Dr. Annye Asa with right, adductor canal, ultrasound guided block. Side rails up, monitors on throughout procedure. See vital signs in flow sheet. Tolerated Procedure well.

## 2023-10-03 NOTE — Anesthesia Procedure Notes (Signed)
Procedure Name: LMA Insertion Date/Time: 10/03/2023 7:36 AM  Performed by: Cleda Clarks, CRNAPre-anesthesia Checklist: Patient identified, Emergency Drugs available, Suction available and Patient being monitored Patient Re-evaluated:Patient Re-evaluated prior to induction Oxygen Delivery Method: Circle system utilized Preoxygenation: Pre-oxygenation with 100% oxygen Induction Type: IV induction Ventilation: Mask ventilation without difficulty LMA: LMA inserted LMA Size: 4.0 Number of attempts: 1 Placement Confirmation: positive ETCO2 Tube secured with: Tape Dental Injury: Teeth and Oropharynx as per pre-operative assessment

## 2023-10-04 ENCOUNTER — Encounter (HOSPITAL_BASED_OUTPATIENT_CLINIC_OR_DEPARTMENT_OTHER): Payer: Self-pay | Admitting: Orthopaedic Surgery

## 2023-10-06 ENCOUNTER — Other Ambulatory Visit (HOSPITAL_BASED_OUTPATIENT_CLINIC_OR_DEPARTMENT_OTHER): Payer: Self-pay | Admitting: Student

## 2023-10-06 ENCOUNTER — Ambulatory Visit (HOSPITAL_BASED_OUTPATIENT_CLINIC_OR_DEPARTMENT_OTHER): Payer: BC Managed Care – PPO | Attending: Orthopaedic Surgery | Admitting: Physical Therapy

## 2023-10-06 ENCOUNTER — Encounter (HOSPITAL_BASED_OUTPATIENT_CLINIC_OR_DEPARTMENT_OTHER): Payer: Self-pay | Admitting: Physical Therapy

## 2023-10-06 DIAGNOSIS — M25561 Pain in right knee: Secondary | ICD-10-CM | POA: Insufficient documentation

## 2023-10-06 DIAGNOSIS — M93261 Osteochondritis dissecans, right knee: Secondary | ICD-10-CM | POA: Diagnosis present

## 2023-10-06 DIAGNOSIS — R2689 Other abnormalities of gait and mobility: Secondary | ICD-10-CM | POA: Insufficient documentation

## 2023-10-06 DIAGNOSIS — M25661 Stiffness of right knee, not elsewhere classified: Secondary | ICD-10-CM | POA: Insufficient documentation

## 2023-10-06 DIAGNOSIS — R600 Localized edema: Secondary | ICD-10-CM | POA: Diagnosis not present

## 2023-10-06 MED ORDER — OXYCODONE HCL 5 MG PO TABS: 5.0000 mg | ORAL_TABLET | ORAL | 0 refills | Status: AC | PRN

## 2023-10-06 NOTE — Therapy (Signed)
OUTPATIENT PHYSICAL THERAPY LOWER EXTREMITY EVALUATION   Patient Name: Jorge Lee MRN: [id number] DOB:Mar 19, 1986, 37 y.o., male Today's Date: 10/06/2023  END OF SESSION:  PT End of Session - 10/06/23 1221     Visit Number 1    Number of Visits 16    Date for PT Re-Evaluation 12/01/23    PT Start Time 1015    PT Stop Time 1058    PT Time Calculation (min) 43 min    Activity Tolerance Patient tolerated treatment well    Behavior During Therapy WFL for tasks assessed/performed             Past Medical History:  Diagnosis Date   Narcolepsy    PONV (postoperative nausea and vomiting)    Past Surgical History:  Procedure Laterality Date   KNEE ARTHROSCOPY     x2   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   Procedure: RIGHT  KNEE UNI ARTHROPLASTY;  Surgeon: Huel Cote, MD;  [facility]:  [facility];  Service: Orthopedics;  Laterality: Right;   Patient Active Problem List   Diagnosis Date Noted   Unilateral primary osteoarthritis, right knee 10/03/2023   Baker's cyst of knee, right 09/09/2020   Osteochondritis dissecans of right knee 01/15/2020   Right foot drop 08/02/2019   Posterior right knee pain 12/05/2017    PCP: Leodis Sias MD  REFERRING PROVIDER: Dr Huel Cote   REFERRING DIAG: right UKA 10/03/2023  THERAPY DIAG:  Acute pain of right knee  Other abnormalities of gait and mobility  Stiffness of right knee, not elsewhere classified  Localized edema  Rationale for Evaluation and Treatment: [facility]   ONSET DATE: 10/03/2023  SUBJECTIVE:   SUBJECTIVE STATEMENT: Patient is a 37 year old male status post right unicompartmental knee replacement.  He has a long history of right knee pain.  He is an avid runner.  His pain and been increasing to the point where he could not run.  At this time he is having significant pain and swelling particularly at night.  He was using crutches for primary ambulation.  PERTINENT HISTORY: No  significant PMH PAIN:  Are you having pain? Yes: NPRS scale: 5/10 right now 9/10 going forward  Pain location: right knee  Pain description: aching  Aggravating factors: night time  Relieving factors: pain meds   PRECAUTIONS: None  RED FLAGS: None   WEIGHT BEARING RESTRICTIONS: Yes WBAT  FALLS:  Has patient fallen in last 6 months? No  LIVING ENVIRONMENT: 10 steps inside   OCCUPATION:  Elementary school physical education teacher  Running:   PLOF: Independent  PATIENT GOALS: To have less pain and to return to running/return to work  NEXT MD VISIT:  Next week  OBJECTIVE:  Note: Objective measures were completed at Evaluation unless otherwise noted.  DIAGNOSTIC FINDINGS: Nothing postop  PATIENT SURVEYS:  FOTO 18% currently 62% expected in 20 visits  COGNITION: Overall cognitive status: Within functional limits for tasks assessed     SENSATION: WFL  EDEMA:  Circumferential: Right 49.4 left 41 cm    POSTURE: No Significant postural limitations  PALPATION:   LOWER EXTREMITY ROM:  Passive ROM Right eval Left eval  Hip flexion    Hip extension    Hip abduction    Hip adduction    Hip internal rotation    Hip external rotation    Knee flexion 38   Knee extension -6   Ankle dorsiflexion    Ankle plantarflexion    Ankle inversion    Ankle eversion     (  Blank rows = not tested)  LOWER EXTREMITY MMT:  MMT Right eval Left eval  Hip flexion    Hip extension    Hip abduction    Hip adduction    Hip internal rotation    Hip external rotation    Knee flexion    Knee extension    Ankle dorsiflexion    Ankle plantarflexion    Ankle inversion    Ankle eversion     (Blank rows = not tested) not taken today secondary to significant swelling   GAIT: Reviewed how to weight-bear with 2 crutches.  Decreased weightbearing on right side compared to left.  Decreased right hip flexion.  We also reviewed how to use single crutch.  Patient advised not  to progress to single crutch until he feels comfortable confident with 2 crutches. TODAY'S TREATMENT:                                                                                                                              DATE:  Manual: Passive range of motion into flexion.   Exercises - Supine Heel Slide with Strap  - 1 x daily - 7 x weekly - 3 sets - 5 reps - 5 sec  hold - Supine Quad Set  - 1 x daily - 7 x weekly - 3 sets - 10 reps - Supine Knee Extension Stretch on Towel Roll  - 1 x daily - 7 x weekly - 3 sets - 5 reps - 5 sec  hold   PATIENT EDUCATION:  Education details: HEP, symptom management, edema management, progression of activities Person educated: Patient Education method: Explanation, Demonstration, Tactile cues, Verbal cues, and Handouts Education comprehension: verbalized understanding, returned demonstration, verbal cues required, tactile cues required, and needs further education  HOME EXERCISE PROGRAM: Access Code: [email or web address] URL: [email or web address] Date: 10/06/2023 Prepared by: Lorayne Bender  ASSESSMENT:  CLINICAL IMPRESSION: Patient is a 37 year old male status post right unicompartmental knee replacement on 10/03/2023.  At this time he presents with expected limitations in knee range of motion, strength, ability to ambulate, and general functional mobility.  He has significant swelling at this time.  His range of motion is limited in flexion.  He would benefit from skilled therapy to return to active lifestyle.  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 OBJECTIVE IMPAIRMENTS: Abnormal gait, decreased activity tolerance, decreased endurance, decreased mobility, difficulty walking, decreased ROM, decreased strength, increased edema, and pain.   ACTIVITY LIMITATIONS: carrying, lifting, bending, standing, squatting, transfers, bed mobility, toileting, dressing, self feeding, and locomotion level  PARTICIPATION  LIMITATIONS: meal prep, cleaning, laundry, driving, shopping, occupation, and yard work  PERSONAL FACTORS: None   REHAB POTENTIAL: Excellent  CLINICAL DECISION MAKING: Stable/uncomplicated  EVALUATION COMPLEXITY: Low   GOALS: Goals reviewed with patient? Yes  SHORT TERM GOALS: Target date: 11/03/2023   Patient will increase right knee pain  Baseline: Goal status: INITIAL  2.  Patient will  demonstrate full knee extension Baseline:  Goal status: INITIAL  3.  Patient will ambulate 500 feet without crutches without significant antalgic gait Baseline:  Goal status: INITIAL  4.  Patient will be independent with base exercise program Baseline:  Goal status: INITIAL  5.  Patient will demonstrate a 3 cm reduction in knee edema Goal status: Initial  LONG TERM GOALS: Target date: 12/01/2023    Patient will go up and down 8 steps with reciprocal gait pattern Baseline:  Goal status: INITIAL  2.  Patient will ambulate community distances without pain Baseline:  Goal status: INITIAL  3.  Patient will return to jogging when cleared by MD Baseline:  Goal status: INITIAL  4.  Patient will be independent with complete exercise program Baseline:  Goal status: INITIAL  PLAN:  PT FREQUENCY: 2x/week  PT DURATION: 8 weeks  PLANNED INTERVENTIONS: 97110-Therapeutic exercises, 97530- Therapeutic activity, [id number]- Neuromuscular re-education, 97535- Self Care, 40102- Manual therapy, [id number]- Gait training, [phone]- Aquatic Therapy, 97014- Electrical stimulation (unattended), 97035- Ultrasound, Patient/Family education, Stair training, Taping, Dry Needling, DME instructions, Cryotherapy, and Moist heat   PLAN FOR NEXT SESSION: Consider vasopneumatic device next visit if he has continued swelling.  Continue passive range of motion.  Work on straight leg raises if able.  Consider weightbearing exercises.  Consider gait training.   Dessie Coma, PT 10/06/2023, 2:14 PM

## 2023-10-11 ENCOUNTER — Ambulatory Visit (HOSPITAL_BASED_OUTPATIENT_CLINIC_OR_DEPARTMENT_OTHER): Payer: BC Managed Care – PPO | Admitting: Physical Therapy

## 2023-10-11 ENCOUNTER — Encounter (HOSPITAL_BASED_OUTPATIENT_CLINIC_OR_DEPARTMENT_OTHER): Payer: Self-pay | Admitting: Physical Therapy

## 2023-10-11 DIAGNOSIS — M25561 Pain in right knee: Secondary | ICD-10-CM

## 2023-10-11 DIAGNOSIS — M93261 Osteochondritis dissecans, right knee: Secondary | ICD-10-CM | POA: Diagnosis not present

## 2023-10-11 DIAGNOSIS — R2689 Other abnormalities of gait and mobility: Secondary | ICD-10-CM

## 2023-10-11 DIAGNOSIS — M25661 Stiffness of right knee, not elsewhere classified: Secondary | ICD-10-CM

## 2023-10-11 DIAGNOSIS — R600 Localized edema: Secondary | ICD-10-CM

## 2023-10-11 NOTE — Therapy (Signed)
OUTPATIENT PHYSICAL THERAPY LOWER EXTREMITY EVALUATION   Patient Name: Jorge Lee MRN: [id number] DOB:1986-07-01, 37 y.o., male Today's Date: 10/14/2023  END OF SESSION:  PT End of Session - 10/14/23 0911     Visit Number 2    Number of Visits 16    Date for PT Re-Evaluation 12/01/23    PT Start Time 1345    PT Stop Time 1427    PT Time Calculation (min) 42 min    Activity Tolerance Patient tolerated treatment well    Behavior During Therapy WFL for tasks assessed/performed              Past Medical History:  Diagnosis Date   Narcolepsy    PONV (postoperative nausea and vomiting)    Past Surgical History:  Procedure Laterality Date   KNEE ARTHROSCOPY     x2   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   Procedure: RIGHT  KNEE UNI ARTHROPLASTY;  Surgeon: Huel Cote, MD;  Location: [facility];  Service: Orthopedics;  Laterality: Right;   Patient Active Problem List   Diagnosis Date Noted   Unilateral primary osteoarthritis, right knee 10/03/2023   Baker's cyst of knee, right 09/09/2020   Osteochondritis dissecans of right knee 01/15/2020   Right foot drop 08/02/2019   Posterior right knee pain 12/05/2017    PCP: Leodis Sias MD  REFERRING PROVIDER: Dr Huel Cote   REFERRING DIAG: right UKA 10/03/2023  THERAPY DIAG:  Acute pain of right knee  Other abnormalities of gait and mobility  Stiffness of right knee, not elsewhere classified  Localized edema  Rationale for Evaluation and Treatment: [facility]   ONSET DATE: 10/03/2023  SUBJECTIVE:   SUBJECTIVE STATEMENT: The patient reports that that during the day his pain is improved.  At night the pain remains severe.  He reports last night the pain approached a 10 out of 10.  Eval: Patient is a 37 year old male status post right unicompartmental knee replacement.  He has a long history of right knee pain.  He is an avid runner.  His pain and been increasing to the point where he  could not run.  At this time he is having significant pain and swelling particularly at night.  He was using crutches for primary ambulation.  PERTINENT HISTORY: No significant PMH PAIN:  Are you having pain? Yes: NPRS scale: 1 out of 10 right now Pain location: right knee  Pain description: aching  Aggravating factors: night time  Relieving factors: pain meds   PRECAUTIONS: None  RED FLAGS: None   WEIGHT BEARING RESTRICTIONS: Yes WBAT  FALLS:  Has patient fallen in last 6 months? No  LIVING ENVIRONMENT: 10 steps inside   OCCUPATION:  Elementary school physical education teacher  Running:   PLOF: Independent  PATIENT GOALS: To have less pain and to return to running/return to work  NEXT MD VISIT:  Next week  OBJECTIVE:  Note: Objective measures were completed at Evaluation unless otherwise noted.  DIAGNOSTIC FINDINGS: Nothing postop  PATIENT SURVEYS:  FOTO 18% currently 62% expected in 20 visits  COGNITION: Overall cognitive status: Within functional limits for tasks assessed     SENSATION: WFL  EDEMA:  Circumferential: Right 49.4 left 41 cm    POSTURE: No Significant postural limitations  PALPATION:   LOWER EXTREMITY ROM:  Passive ROM Right eval Left eval   Hip flexion     Hip extension     Hip abduction     Hip adduction  Hip internal rotation     Hip external rotation     Knee flexion 38  85  Knee extension -6  -2  Ankle dorsiflexion     Ankle plantarflexion     Ankle inversion     Ankle eversion      (Blank rows = not tested)  LOWER EXTREMITY MMT:  MMT Right eval Left eval  Hip flexion    Hip extension    Hip abduction    Hip adduction    Hip internal rotation    Hip external rotation    Knee flexion    Knee extension    Ankle dorsiflexion    Ankle plantarflexion    Ankle inversion    Ankle eversion     (Blank rows = not tested) not taken today secondary to significant swelling   GAIT: Reviewed how to  weight-bear with 2 crutches.  Decreased weightbearing on right side compared to left.  Decreased right hip flexion.  We also reviewed how to use single crutch.  Patient advised not to progress to single crutch until he feels comfortable confident with 2 crutches. TODAY'S TREATMENT:                                                                                                                              DATE:  Manaul: PROM into flexion and extension  Quad set 3x10  Nu-step 5 min for self stretch   SLR 3x10   Heel/toe 3x10      Eval: Manual: Passive range of motion into flexion.   Exercises - Supine Heel Slide with Strap  - 1 x daily - 7 x weekly - 3 sets - 5 reps - 5 sec  hold - Supine Quad Set  - 1 x daily - 7 x weekly - 3 sets - 10 reps - Supine Knee Extension Stretch on Towel Roll  - 1 x daily - 7 x weekly - 3 sets - 5 reps - 5 sec  hold   PATIENT EDUCATION:  Education details: HEP, symptom management, edema management, progression of activities Person educated: Patient Education method: Explanation, Demonstration, Tactile cues, Verbal cues, and Handouts Education comprehension: verbalized understanding, returned demonstration, verbal cues required, tactile cues required, and needs further education  HOME EXERCISE PROGRAM: Access Code: [email or web address] URL: [email or web address] Date: 10/06/2023 Prepared by: Lorayne Bender  ASSESSMENT:  CLINICAL IMPRESSION: The patient's range of motion is improved significantly.  His total arc was measured at -2 to 85 degrees.  He had much less pain with flexion today.  He still continues to have significant swelling but is improved.  He had increasing pain with standing weight shifting.  Continue using 1 crutch as well as swelling improves.  He was able to complete a straight leg raise today.  Therapy will continue to progress as tolerated.    Eval hold patient is a 37 year old male status post right unicompartmental knee  replacement on 10/03/2023.  At this time  he presents with expected limitations in knee range of motion, strength, ability to ambulate, and general functional mobility.  He has significant swelling at this time.  His range of motion is limited in flexion.  He would benefit from skilled therapy to return to active lifestyle.  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 OBJECTIVE IMPAIRMENTS: Abnormal gait, decreased activity tolerance, decreased endurance, decreased mobility, difficulty walking, decreased ROM, decreased strength, increased edema, and pain.   ACTIVITY LIMITATIONS: carrying, lifting, bending, standing, squatting, transfers, bed mobility, toileting, dressing, self feeding, and locomotion level  PARTICIPATION LIMITATIONS: meal prep, cleaning, laundry, driving, shopping, occupation, and yard work  PERSONAL FACTORS: None   REHAB POTENTIAL: Excellent  CLINICAL DECISION MAKING: Stable/uncomplicated  EVALUATION COMPLEXITY: Low   GOALS: Goals reviewed with patient? Yes  SHORT TERM GOALS: Target date: 11/03/2023   Patient will increase right knee pain  Baseline: Goal status: INITIAL  2.  Patient will demonstrate full knee extension Baseline:  Goal status: INITIAL  3.  Patient will ambulate 500 feet without crutches without significant antalgic gait Baseline:  Goal status: INITIAL  4.  Patient will be independent with base exercise program Baseline:  Goal status: INITIAL  5.  Patient will demonstrate a 3 cm reduction in knee edema Goal status: Initial  LONG TERM GOALS: Target date: 12/01/2023    Patient will go up and down 8 steps with reciprocal gait pattern Baseline:  Goal status: INITIAL  2.  Patient will ambulate community distances without pain Baseline:  Goal status: INITIAL  3.  Patient will return to jogging when cleared by MD Baseline:  Goal status: INITIAL  4.  Patient will be independent with complete exercise  program Baseline:  Goal status: INITIAL  PLAN:  PT FREQUENCY: 2x/week  PT DURATION: 8 weeks  PLANNED INTERVENTIONS: 97110-Therapeutic exercises, 97530- Therapeutic activity, [id number]- Neuromuscular re-education, 97535- Self Care, 16109- Manual therapy, [id number]- Gait training, [phone]- Aquatic Therapy, 97014- Electrical stimulation (unattended), 97035- Ultrasound, Patient/Family education, Stair training, Taping, Dry Needling, DME instructions, Cryotherapy, and Moist heat   PLAN FOR NEXT SESSION: Consider vasopneumatic device next visit if he has continued swelling.  Continue passive range of motion.  Work on straight leg raises if able.  Consider weightbearing exercises.  Consider gait training.   Dessie Coma, PT 10/14/2023, 9:11 AM

## 2023-10-14 ENCOUNTER — Encounter (HOSPITAL_BASED_OUTPATIENT_CLINIC_OR_DEPARTMENT_OTHER): Payer: Self-pay | Admitting: Physical Therapy

## 2023-10-14 ENCOUNTER — Other Ambulatory Visit: Payer: Self-pay

## 2023-10-16 ENCOUNTER — Encounter (HOSPITAL_BASED_OUTPATIENT_CLINIC_OR_DEPARTMENT_OTHER): Payer: BC Managed Care – PPO | Admitting: Orthopaedic Surgery

## 2023-10-18 ENCOUNTER — Other Ambulatory Visit (HOSPITAL_BASED_OUTPATIENT_CLINIC_OR_DEPARTMENT_OTHER): Payer: Self-pay | Admitting: Orthopaedic Surgery

## 2023-10-18 ENCOUNTER — Encounter (HOSPITAL_BASED_OUTPATIENT_CLINIC_OR_DEPARTMENT_OTHER): Payer: Self-pay | Admitting: Orthopaedic Surgery

## 2023-10-18 ENCOUNTER — Encounter (HOSPITAL_BASED_OUTPATIENT_CLINIC_OR_DEPARTMENT_OTHER): Payer: Self-pay | Admitting: Physical Therapy

## 2023-10-18 ENCOUNTER — Ambulatory Visit (HOSPITAL_BASED_OUTPATIENT_CLINIC_OR_DEPARTMENT_OTHER): Payer: BC Managed Care – PPO

## 2023-10-18 ENCOUNTER — Ambulatory Visit (HOSPITAL_BASED_OUTPATIENT_CLINIC_OR_DEPARTMENT_OTHER): Payer: BC Managed Care – PPO | Attending: Orthopaedic Surgery | Admitting: Physical Therapy

## 2023-10-18 ENCOUNTER — Ambulatory Visit (HOSPITAL_BASED_OUTPATIENT_CLINIC_OR_DEPARTMENT_OTHER): Payer: BC Managed Care – PPO | Admitting: Orthopaedic Surgery

## 2023-10-18 DIAGNOSIS — R600 Localized edema: Secondary | ICD-10-CM | POA: Diagnosis present

## 2023-10-18 DIAGNOSIS — R2689 Other abnormalities of gait and mobility: Secondary | ICD-10-CM | POA: Insufficient documentation

## 2023-10-18 DIAGNOSIS — M25561 Pain in right knee: Secondary | ICD-10-CM | POA: Insufficient documentation

## 2023-10-18 DIAGNOSIS — M93261 Osteochondritis dissecans, right knee: Secondary | ICD-10-CM

## 2023-10-18 DIAGNOSIS — M25661 Stiffness of right knee, not elsewhere classified: Secondary | ICD-10-CM | POA: Insufficient documentation

## 2023-10-18 DIAGNOSIS — M1711 Unilateral primary osteoarthritis, right knee: Secondary | ICD-10-CM

## 2023-10-18 MED ORDER — IBUPROFEN 800 MG PO TABS: 800.0000 mg | ORAL_TABLET | Freq: Three times a day (TID) | ORAL | 0 refills | Status: AC | PRN

## 2023-10-18 NOTE — Progress Notes (Signed)
Post Operative Evaluation    Procedure/Date of Surgery: Right knee unicompartmental knee arthroplasty 11/19  Interval History:   Presents today 2 weeks status post right knee partial knee replacement overall doing very well.  He is having some difficulty with sleeping.  He is progressing his weightbearing quite nicely.  He is working with physical therapy.   PMH/PSH/Family History/Social History/Meds/Allergies:    Past Medical History:  Diagnosis Date   Narcolepsy    PONV (postoperative nausea and vomiting)    Past Surgical History:  Procedure Laterality Date   KNEE ARTHROSCOPY     x2   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   Procedure: RIGHT  KNEE UNI ARTHROPLASTY;  Surgeon: Huel Cote, MD;  Location: [facility];  Service: Orthopedics;  Laterality: Right;   Social History   Socioeconomic History   Marital status: Married    Spouse name: Not on file   Number of children: Not on file   Years of education: Not on file   Highest education level: Not on file  Occupational History   Not on file  Tobacco Use   Smoking status: Never   Smokeless tobacco: Never  Vaping Use   Vaping status: Never Used  Substance and Sexual Activity   Alcohol use: Yes    Comment: occas   Drug use: No   Sexual activity: Not on file  Other Topics Concern   Not on file  Social History Narrative   Not on file   Social Determinants of Health   Financial Resource Strain: Not on file  Food Insecurity: Not on file  Transportation Needs: Not on file  Physical Activity: Not on file  Stress: Not on file  Social Connections: Not on file   No family history on file. No Known Allergies Current Outpatient Medications  Medication Sig Dispense Refill   amphetamine-dextroamphetamine (ADDERALL) 20 MG tablet Take 10 mg by mouth 2 (two) times daily.  0   aspirin EC 325 MG tablet Take 1 tablet (325 mg total) by mouth daily. 30 tablet 0    Multiple Vitamins-Minerals (MULTIVITAMIN WITH MINERALS) tablet Take 1 tablet by mouth daily.     oxyCODONE (ROXICODONE) 5 MG immediate release tablet Take 1 tablet (5 mg total) by mouth every 4 (four) hours as needed for severe pain (pain score 7-10) or breakthrough pain. 20 tablet 0   No current facility-administered medications for this visit.   No results found.  Review of Systems:   A ROS was performed including pertinent positives and negatives as documented in the HPI.   Musculoskeletal Exam:    There were no vitals taken for this visit.  Right knee incision is well-appearing without erythema or drainage.  Range of motion is 0 to 90 degrees.  This is no pain.  Mild quad atrophy.  Distal neurosensory exam is intact  Imaging:    4 views right knee: Status post unicompartmental medial knee arthroplasty without complication  I personally reviewed and interpreted the radiographs.   Assessment:   2 weeks status post right knee medial unicompartmental knee arthroplasty.  At this time he will advance to weightbearing as tolerated.  I will plan to see him back in 4 weeks for reassessment  Plan :    -Return to clinic 4 weeks for reassessment  I personally saw and evaluated the patient, and participated in the management and treatment plan.  Huel Cote, MD Attending Physician, Orthopedic Surgery  This document was dictated using Dragon voice recognition software. A reasonable attempt at proof reading has been made to minimize errors.

## 2023-10-18 NOTE — Therapy (Signed)
OUTPATIENT PHYSICAL THERAPY LOWER EXTREMITY EVALUATION   Patient Name: Jorge Lee MRN: [id number] DOB:1986/03/08, 37 y.o., male Today's Date: 10/18/2023  END OF SESSION:  PT End of Session - 10/18/23 1349     Visit Number 3    Number of Visits 16    Date for PT Re-Evaluation 12/01/23    PT Start Time 1345    PT Stop Time 1428    PT Time Calculation (min) 43 min    Activity Tolerance Patient tolerated treatment well    Behavior During Therapy WFL for tasks assessed/performed              Past Medical History:  Diagnosis Date   Narcolepsy    PONV (postoperative nausea and vomiting)    Past Surgical History:  Procedure Laterality Date   KNEE ARTHROSCOPY     x2   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   Procedure: RIGHT  KNEE UNI ARTHROPLASTY;  Surgeon: Huel Cote, MD;  Location: [facility];  Service: Orthopedics;  Laterality: Right;   Patient Active Problem List   Diagnosis Date Noted   Unilateral primary osteoarthritis, right knee 10/03/2023   Baker's cyst of knee, right 09/09/2020   Osteochondritis dissecans of right knee 01/15/2020   Right foot drop 08/02/2019   Posterior right knee pain 12/05/2017    PCP: Leodis Sias MD  REFERRING PROVIDER: Dr Huel Cote   REFERRING DIAG: right UKA 10/03/2023  THERAPY DIAG:  Acute pain of right knee  Other abnormalities of gait and mobility  Stiffness of right knee, not elsewhere classified  Localized edema  Rationale for Evaluation and Treatment: [facility]   ONSET DATE: 10/03/2023  SUBJECTIVE:   SUBJECTIVE STATEMENT: The patient continues to have severe pain at night. His pain during the day is better. He has ben to the MD who wants him to progress his weight bearing as tolerated.   Eval: Patient is a 37 year old male status post right unicompartmental knee replacement.  He has a long history of right knee pain.  He is an avid runner.  His pain and been increasing to the point  where he could not run.  At this time he is having significant pain and swelling particularly at night.  He was using crutches for primary ambulation.  PERTINENT HISTORY: No significant PMH PAIN:  Are you having pain? Yes: NPRS scale: 1 out of 10 right now Pain location: right knee  Pain description: aching  Aggravating factors: night time  Relieving factors: pain meds   PRECAUTIONS: None  RED FLAGS: None   WEIGHT BEARING RESTRICTIONS: Yes WBAT  FALLS:  Has patient fallen in last 6 months? No  LIVING ENVIRONMENT: 10 steps inside   OCCUPATION:  Elementary school physical education teacher  Running:   PLOF: Independent  PATIENT GOALS: To have less pain and to return to running/return to work  NEXT MD VISIT:  Next week  OBJECTIVE:  Note: Objective measures were completed at Evaluation unless otherwise noted.  DIAGNOSTIC FINDINGS: Nothing postop  PATIENT SURVEYS:  FOTO 18% currently 62% expected in 20 visits  COGNITION: Overall cognitive status: Within functional limits for tasks assessed     SENSATION: WFL  EDEMA:  Circumferential: Right 49.4 left 41 cm    POSTURE: No Significant postural limitations  PALPATION:   LOWER EXTREMITY ROM:  Passive ROM Right eval Left eval  Right  12/4  Hip flexion      Hip extension      Hip abduction  Hip adduction      Hip internal rotation      Hip external rotation      Knee flexion 38  85 100  Knee extension -6  -2   Ankle dorsiflexion      Ankle plantarflexion      Ankle inversion      Ankle eversion       (Blank rows = not tested)  LOWER EXTREMITY MMT:  MMT Right eval Left eval  Hip flexion    Hip extension    Hip abduction    Hip adduction    Hip internal rotation    Hip external rotation    Knee flexion    Knee extension    Ankle dorsiflexion    Ankle plantarflexion    Ankle inversion    Ankle eversion     (Blank rows = not tested) not taken today secondary to significant  swelling   GAIT: Reviewed how to weight-bear with 2 crutches.  Decreased weightbearing on right side compared to left.  Decreased right hip flexion.  We also reviewed how to use single crutch.  Patient advised not to progress to single crutch until he feels comfortable confident with 2 crutches. TODAY'S TREATMENT:                                                                                                                              DATE:  12/4 Nu-step 5 min for self stretch   Supine:   SLR 3x10  SAQ 3x10   Heel raise 2x10   Standing:   Last visit:  Manual: PROM into flexion and extension  Quad set 3x10  Nu-step 5 min for self stretch   SLR 3x10   Heel/toe 3x10   Cane training: reviewed proper fitting and technique with a cane.    Eval: Manual: Passive range of motion into flexion.   Exercises - Supine Heel Slide with Strap  - 1 x daily - 7 x weekly - 3 sets - 5 reps - 5 sec  hold - Supine Quad Set  - 1 x daily - 7 x weekly - 3 sets - 10 reps - Supine Knee Extension Stretch on Towel Roll  - 1 x daily - 7 x weekly - 3 sets - 5 reps - 5 sec  hold   PATIENT EDUCATION:  Education details: HEP, symptom management, edema management, progression of activities Person educated: Patient Education method: Explanation, Demonstration, Tactile cues, Verbal cues, and Handouts Education comprehension: verbalized understanding, returned demonstration, verbal cues required, tactile cues required, and needs further education  HOME EXERCISE PROGRAM: Access Code: [email or web address] URL: [email or web address] Date: 10/06/2023 Prepared by: Lorayne Bender  ASSESSMENT:  CLINICAL IMPRESSION: The patient continues to make great jumps with ROM. His total arc was measured at 0-03 today. He tolerated there-ex well. We added in SAQ and standing slow march for home He may try not doing exercise tomorrow to see if he sleeps better. We  also reviewed cane training with him. He may  progress to a cane.    Eval hold patient is a 37 year old male status post right unicompartmental knee replacement on 10/03/2023.  At this time he presents with expected limitations in knee range of motion, strength, ability to ambulate, and general functional mobility.  He has significant swelling at this time.  His range of motion is limited in flexion.  He would benefit from skilled therapy to return to active lifestyle.  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 OBJECTIVE IMPAIRMENTS: Abnormal gait, decreased activity tolerance, decreased endurance, decreased mobility, difficulty walking, decreased ROM, decreased strength, increased edema, and pain.   ACTIVITY LIMITATIONS: carrying, lifting, bending, standing, squatting, transfers, bed mobility, toileting, dressing, self feeding, and locomotion level  PARTICIPATION LIMITATIONS: meal prep, cleaning, laundry, driving, shopping, occupation, and yard work  PERSONAL FACTORS: None   REHAB POTENTIAL: Excellent  CLINICAL DECISION MAKING: Stable/uncomplicated  EVALUATION COMPLEXITY: Low   GOALS: Goals reviewed with patient? Yes  SHORT TERM GOALS: Target date: 11/03/2023   Patient will increase right knee pain  Baseline: Goal status: INITIAL  2.  Patient will demonstrate full knee extension Baseline:  Goal status: INITIAL  3.  Patient will ambulate 500 feet without crutches without significant antalgic gait Baseline:  Goal status: INITIAL  4.  Patient will be independent with base exercise program Baseline:  Goal status: INITIAL  5.  Patient will demonstrate a 3 cm reduction in knee edema Goal status: Initial  LONG TERM GOALS: Target date: 12/01/2023    Patient will go up and down 8 steps with reciprocal gait pattern Baseline:  Goal status: INITIAL  2.  Patient will ambulate community distances without pain Baseline:  Goal status: INITIAL  3.  Patient will return to jogging when cleared by  MD Baseline:  Goal status: INITIAL  4.  Patient will be independent with complete exercise program Baseline:  Goal status: INITIAL  PLAN:  PT FREQUENCY: 2x/week  PT DURATION: 8 weeks  PLANNED INTERVENTIONS: 97110-Therapeutic exercises, 97530- Therapeutic activity, [id number]- Neuromuscular re-education, 97535- Self Care, [id number]- Manual therapy, [id number]- Gait training, [phone]- Aquatic Therapy, 97014- Electrical stimulation (unattended), 97035- Ultrasound, Patient/Family education, Stair training, Taping, Dry Needling, DME instructions, Cryotherapy, and Moist heat   PLAN FOR NEXT SESSION: Consider vasopneumatic device next visit if he has continued swelling.  Continue passive range of motion.  Work on straight leg raises if able.  Consider weightbearing exercises.  Consider gait training.   Dessie Coma, PT 10/18/2023, 2:03 PM

## 2023-10-20 ENCOUNTER — Encounter (HOSPITAL_BASED_OUTPATIENT_CLINIC_OR_DEPARTMENT_OTHER): Payer: Self-pay | Admitting: Physical Therapy

## 2023-10-20 ENCOUNTER — Ambulatory Visit (HOSPITAL_BASED_OUTPATIENT_CLINIC_OR_DEPARTMENT_OTHER): Payer: BC Managed Care – PPO | Admitting: Physical Therapy

## 2023-10-20 DIAGNOSIS — M25661 Stiffness of right knee, not elsewhere classified: Secondary | ICD-10-CM

## 2023-10-20 DIAGNOSIS — M25561 Pain in right knee: Secondary | ICD-10-CM | POA: Diagnosis not present

## 2023-10-20 DIAGNOSIS — R2689 Other abnormalities of gait and mobility: Secondary | ICD-10-CM

## 2023-10-20 DIAGNOSIS — R600 Localized edema: Secondary | ICD-10-CM

## 2023-10-20 NOTE — Therapy (Signed)
OUTPATIENT PHYSICAL THERAPY LOWER EXTREMITY EVALUATION   Patient Name: Jorge Lee MRN: [id number] DOB:06-03-1986, 37 y.o., male Today's Date: 10/20/2023  END OF SESSION:  PT End of Session - 10/20/23 1501     Visit Number 4    Number of Visits 16    Date for PT Re-Evaluation 12/01/23    PT Start Time 1015    PT Stop Time 1058    PT Time Calculation (min) 43 min    Activity Tolerance Patient tolerated treatment well    Behavior During Therapy WFL for tasks assessed/performed              Past Medical History:  Diagnosis Date   Narcolepsy    PONV (postoperative nausea and vomiting)    Past Surgical History:  Procedure Laterality Date   KNEE ARTHROSCOPY     x2   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   Procedure: RIGHT  KNEE UNI ARTHROPLASTY;  Surgeon: Huel Cote, MD;  Location: [facility];  Service: Orthopedics;  Laterality: Right;   Patient Active Problem List   Diagnosis Date Noted   Unilateral primary osteoarthritis, right knee 10/03/2023   Baker's cyst of knee, right 09/09/2020   Osteochondritis dissecans of right knee 01/15/2020   Right foot drop 08/02/2019   Posterior right knee pain 12/05/2017    PCP: Leodis Sias MD  REFERRING PROVIDER: Dr Huel Cote   REFERRING DIAG: right UKA 10/03/2023  THERAPY DIAG:  Acute pain of right knee  Other abnormalities of gait and mobility  Stiffness of right knee, not elsewhere classified  Localized edema  Rationale for Evaluation and Treatment: [facility]   ONSET DATE: 10/03/2023  SUBJECTIVE:   SUBJECTIVE STATEMENT: The past two nights the pain has improved. During the day he continues to do well. He feels like it is a little tight today.   Eval: Patient is a 37 year old male status post right unicompartmental knee replacement.  He has a long history of right knee pain.  He is an avid runner.  His pain and been increasing to the point where he could not run.  At this time he is  having significant pain and swelling particularly at night.  He was using crutches for primary ambulation.  PERTINENT HISTORY: No significant PMH PAIN:  Are you having pain? Yes: NPRS scale: 1 out of 10 right now Pain location: right knee  Pain description: aching  Aggravating factors: night time  Relieving factors: pain meds   PRECAUTIONS: None  RED FLAGS: None   WEIGHT BEARING RESTRICTIONS: Yes WBAT  FALLS:  Has patient fallen in last 6 months? No  LIVING ENVIRONMENT: 10 steps inside   OCCUPATION:  Elementary school physical education teacher  Running:   PLOF: Independent  PATIENT GOALS: To have less pain and to return to running/return to work  NEXT MD VISIT:  Next week  OBJECTIVE:  Note: Objective measures were completed at Evaluation unless otherwise noted.  DIAGNOSTIC FINDINGS: Nothing postop  PATIENT SURVEYS:  FOTO 18% currently 62% expected in 20 visits  COGNITION: Overall cognitive status: Within functional limits for tasks assessed     SENSATION: WFL  EDEMA:  Circumferential: Right 49.4 left 41 cm    POSTURE: No Significant postural limitations  PALPATION:   LOWER EXTREMITY ROM:  Passive ROM Right eval Left eval  Right  12/4  Hip flexion      Hip extension      Hip abduction      Hip adduction  Hip internal rotation      Hip external rotation      Knee flexion 38  85 100  Knee extension -6  -2   Ankle dorsiflexion      Ankle plantarflexion      Ankle inversion      Ankle eversion       (Blank rows = not tested)  LOWER EXTREMITY MMT:  MMT Right eval Left eval  Hip flexion    Hip extension    Hip abduction    Hip adduction    Hip internal rotation    Hip external rotation    Knee flexion    Knee extension    Ankle dorsiflexion    Ankle plantarflexion    Ankle inversion    Ankle eversion     (Blank rows = not tested) not taken today secondary to significant swelling   GAIT: Reviewed how to weight-bear with  2 crutches.  Decreased weightbearing on right side compared to left.  Decreased right hip flexion.  We also reviewed how to use single crutch.  Patient advised not to progress to single crutch until he feels comfortable confident with 2 crutches. TODAY'S TREATMENT:                                                                                                                              DATE:  12/4 Nu-step 5 min for self stretch   Supine:   SLR 3x10  SAQ 3x10   Heel raise 2x10   Standing:   Last visit:  Manual: PROM into flexion and extension  Quad set 3x10  Nu-step 5 min for self stretch   SLR 3x10   Heel/toe 3x10   Cane training: reviewed proper fitting and technique with a cane.    Eval: Manual: Passive range of motion into flexion.   Exercises - Supine Heel Slide with Strap  - 1 x daily - 7 x weekly - 3 sets - 5 reps - 5 sec  hold - Supine Quad Set  - 1 x daily - 7 x weekly - 3 sets - 10 reps - Supine Knee Extension Stretch on Towel Roll  - 1 x daily - 7 x weekly - 3 sets - 5 reps - 5 sec  hold   PATIENT EDUCATION:  Education details: HEP, symptom management, edema management, progression of activities Person educated: Patient Education method: Explanation, Demonstration, Tactile cues, Verbal cues, and Handouts Education comprehension: verbalized understanding, returned demonstration, verbal cues required, tactile cues required, and needs further education  HOME EXERCISE PROGRAM: Access Code: [email or web address] URL: [email or web address] Date: 10/06/2023 Prepared by: Lorayne Bender  ASSESSMENT:  CLINICAL IMPRESSION: The patients flexion was measured at 107 today despite feeling tight. We added in a low step for closed chain strengthening. He tolerated well. Per visual inspection his swelling is improving. We will continue to progress towards functional strengthening. See below for goal specific progress     Eval hold patient  is a 37 year old male status  post right unicompartmental knee replacement on 10/03/2023.  At this time he presents with expected limitations in knee range of motion, strength, ability to ambulate, and general functional mobility.  He has significant swelling at this time.  His range of motion is limited in flexion.  He would benefit from skilled therapy to return to active lifestyle.  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 OBJECTIVE IMPAIRMENTS: Abnormal gait, decreased activity tolerance, decreased endurance, decreased mobility, difficulty walking, decreased ROM, decreased strength, increased edema, and pain.   ACTIVITY LIMITATIONS: carrying, lifting, bending, standing, squatting, transfers, bed mobility, toileting, dressing, self feeding, and locomotion level  PARTICIPATION LIMITATIONS: meal prep, cleaning, laundry, driving, shopping, occupation, and yard work  PERSONAL FACTORS: None   REHAB POTENTIAL: Excellent  CLINICAL DECISION MAKING: Stable/uncomplicated  EVALUATION COMPLEXITY: Low   GOALS: Goals reviewed with patient? Yes  SHORT TERM GOALS: Target date: 11/03/2023   Patient will increase right knee strength by 5 lbs  Baseline: Goal status: goal adjusted not tested 12/6  2.  Patient will demonstrate full knee extension Baseline:  Goal status: improving -2 12/6   3.  Patient will ambulate 500 feet without crutches without significant antalgic gait Baseline:  Goal status: working with cane   4.  Patient will be independent with base exercise program Baseline:  Goal status: INITIAL  5.  Patient will demonstrate a 3 cm reduction in knee edema Goal status: Initial  LONG TERM GOALS: Target date: 12/01/2023    Patient will go up and down 8 steps with reciprocal gait pattern Baseline:  Goal status: INITIAL  2.  Patient will ambulate community distances without pain Baseline:  Goal status: INITIAL  3.  Patient will return to jogging when cleared by MD Baseline:   Goal status: INITIAL  4.  Patient will be independent with complete exercise program Baseline:  Goal status: INITIAL  PLAN:  PT FREQUENCY: 2x/week  PT DURATION: 8 weeks  PLANNED INTERVENTIONS: 97110-Therapeutic exercises, 97530- Therapeutic activity, [id number]- Neuromuscular re-education, 97535- Self Care, [id number]- Manual therapy, [id number]- Gait training, [phone]- Aquatic Therapy, 97014- Electrical stimulation (unattended), 97035- Ultrasound, Patient/Family education, Stair training, Taping, Dry Needling, DME instructions, Cryotherapy, and Moist heat   PLAN FOR NEXT SESSION: Consider vasopneumatic device next visit if he has continued swelling.  Continue passive range of motion.  Work on straight leg raises if able.  Consider weightbearing exercises.  Consider gait training.   Dessie Coma, PT 10/20/2023, 3:06 PM

## 2023-10-24 ENCOUNTER — Encounter (HOSPITAL_BASED_OUTPATIENT_CLINIC_OR_DEPARTMENT_OTHER): Payer: Self-pay | Admitting: Physical Therapy

## 2023-10-24 ENCOUNTER — Ambulatory Visit (HOSPITAL_BASED_OUTPATIENT_CLINIC_OR_DEPARTMENT_OTHER): Payer: BC Managed Care – PPO | Admitting: Physical Therapy

## 2023-10-24 DIAGNOSIS — M25661 Stiffness of right knee, not elsewhere classified: Secondary | ICD-10-CM

## 2023-10-24 DIAGNOSIS — M25561 Pain in right knee: Secondary | ICD-10-CM | POA: Diagnosis not present

## 2023-10-24 DIAGNOSIS — R600 Localized edema: Secondary | ICD-10-CM

## 2023-10-24 DIAGNOSIS — R2689 Other abnormalities of gait and mobility: Secondary | ICD-10-CM

## 2023-10-24 NOTE — Therapy (Signed)
OUTPATIENT PHYSICAL THERAPY LOWER EXTREMITY TREATMENT   Patient Name: Jorge Lee MRN: [id number] DOB:12/08/1985, 37 y.o., male Today's Date: 10/24/2023  END OF SESSION:  PT End of Session - 10/24/23 1303     Visit Number 5    Number of Visits 16    Date for PT Re-Evaluation 12/01/23    PT Start Time 1300    PT Stop Time 1342    PT Time Calculation (min) 42 min    Activity Tolerance Patient tolerated treatment well;No increased pain    Behavior During Therapy WFL for tasks assessed/performed              Past Medical History:  Diagnosis Date   Narcolepsy    PONV (postoperative nausea and vomiting)    Past Surgical History:  Procedure Laterality Date   KNEE ARTHROSCOPY     x2   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   Procedure: RIGHT  KNEE UNI ARTHROPLASTY;  Surgeon: Huel Cote, MD;  Location: [facility];  Service: Orthopedics;  Laterality: Right;   Patient Active Problem List   Diagnosis Date Noted   Unilateral primary osteoarthritis, right knee 10/03/2023   Baker's cyst of knee, right 09/09/2020   Osteochondritis dissecans of right knee 01/15/2020   Right foot drop 08/02/2019   Posterior right knee pain 12/05/2017    PCP: Leodis Sias MD  REFERRING PROVIDER: Dr Huel Cote   REFERRING DIAG: right UKA 10/03/2023  THERAPY DIAG:  Acute pain of right knee  Stiffness of right knee, not elsewhere classified  Localized edema  Other abnormalities of gait and mobility  Rationale for Evaluation and Treatment: [facility]   ONSET DATE: 10/03/2023  SUBJECTIVE:   SUBJECTIVE STATEMENT: Pt states he is sleeping better at night with less knee pain.    Eval: Patient is a 37 year old male status post right unicompartmental knee replacement.  He has a long history of right knee pain.  He is an avid runner.  His pain and been increasing to the point where he could not run.  At this time he is having significant pain and swelling  particularly at night.  He was using crutches for primary ambulation.  PERTINENT HISTORY: No significant PMH PAIN:  Are you having pain? Yes: NPRS scale: 1 out of 10 right now Pain location: right knee  Pain description: aching  Aggravating factors: night time  Relieving factors: pain meds   PRECAUTIONS: None  RED FLAGS: None   WEIGHT BEARING RESTRICTIONS: Yes WBAT  FALLS:  Has patient fallen in last 6 months? No  LIVING ENVIRONMENT: 10 steps inside   OCCUPATION:  Elementary school physical education teacher  Running:   PLOF: Independent  PATIENT GOALS: To have less pain and to return to running/return to work  NEXT MD VISIT:  Next week  OBJECTIVE:  Note: Objective measures were completed at Evaluation unless otherwise noted.  DIAGNOSTIC FINDINGS: Nothing postop  PATIENT SURVEYS:  FOTO 18% currently 62% expected in 20 visits  COGNITION: Overall cognitive status: Within functional limits for tasks assessed     SENSATION: WFL  EDEMA:  Circumferential: Right 49.4 left 41 cm    POSTURE: No Significant postural limitations  PALPATION:   LOWER EXTREMITY ROM:  Passive ROM Right eval Left eval  Right  12/4  Hip flexion      Hip extension      Hip abduction      Hip adduction      Hip internal rotation      Hip  external rotation      Knee flexion 38  85 100  Knee extension -6  -2   Ankle dorsiflexion      Ankle plantarflexion      Ankle inversion      Ankle eversion       (Blank rows = not tested)  LOWER EXTREMITY MMT:  MMT Right eval Left eval  Hip flexion    Hip extension    Hip abduction    Hip adduction    Hip internal rotation    Hip external rotation    Knee flexion    Knee extension    Ankle dorsiflexion    Ankle plantarflexion    Ankle inversion    Ankle eversion     (Blank rows = not tested) not taken today secondary to significant swelling   GAIT: Reviewed how to weight-bear with 2 crutches.  Decreased weightbearing  on right side compared to left.  Decreased right hip flexion.  We also reviewed how to use single crutch.  Patient advised not to progress to single crutch until he feels comfortable confident with 2 crutches. TODAY'S TREATMENT:                                                                                                                              DATE:  12/10 Supine SLR 3x10, 2.5# SAQ 3x10, 2.5#  Seated LAQ 2x12, 2.5#  Standing   A/P weight shifts to calf raises x20  Hip abduction- bil 2x12 Manual  Knee PROM into flexion & ext; flexion & extn mobs  12/4 Nu-step 5 min for self stretch   Supine:   SLR 3x10  SAQ 3x10   Heel raise 2x10   Standing:   Last visit:  Manual: PROM into flexion and extension  Quad set 3x10  Nu-step 5 min for self stretch   SLR 3x10   Heel/toe 3x10   Cane training: reviewed proper fitting and technique with a cane.    Eval: Manual: Passive range of motion into flexion.   Exercises - Supine Heel Slide with Strap  - 1 x daily - 7 x weekly - 3 sets - 5 reps - 5 sec  hold - Supine Quad Set  - 1 x daily - 7 x weekly - 3 sets - 10 reps - Supine Knee Extension Stretch on Towel Roll  - 1 x daily - 7 x weekly - 3 sets - 5 reps - 5 sec  hold   PATIENT EDUCATION:  Education details: HEP, symptom management, edema management, progression of activities Person educated: Patient Education method: Explanation, Demonstration, Tactile cues, Verbal cues, and Handouts Education comprehension: verbalized understanding, returned demonstration, verbal cues required, tactile cues required, and needs further education  HOME EXERCISE PROGRAM: Access Code: [email or web address] URL: [email or web address] Date: 10/06/2023 Prepared by: Lorayne Bender  ASSESSMENT:  CLINICAL IMPRESSION: Session focused on ther ex to promote global LE strengthening. Pt able to progress to using weights for several  interventions and progressed CKC LE strengthening today  without increased pain and minimal increased fatigue. Pt able to increase passive knee flexion to 112 today. Pt continues to have difficulty with R knee ROM and LE muscular strength secondary to R knee edema, pain, and muscular strength -although steady progress is being made. Continue to progress LE strength and ROM as tolerated.     Eval hold patient is a 37 year old male status post right unicompartmental knee replacement on 10/03/2023.  At this time he presents with expected limitations in knee range of motion, strength, ability to ambulate, and general functional mobility.  He has significant swelling at this time.  His range of motion is limited in flexion.  He would benefit from skilled therapy to return to active lifestyle.  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 OBJECTIVE IMPAIRMENTS: Abnormal gait, decreased activity tolerance, decreased endurance, decreased mobility, difficulty walking, decreased ROM, decreased strength, increased edema, and pain.   ACTIVITY LIMITATIONS: carrying, lifting, bending, standing, squatting, transfers, bed mobility, toileting, dressing, self feeding, and locomotion level  PARTICIPATION LIMITATIONS: meal prep, cleaning, laundry, driving, shopping, occupation, and yard work  PERSONAL FACTORS: None   REHAB POTENTIAL: Excellent  CLINICAL DECISION MAKING: Stable/uncomplicated  EVALUATION COMPLEXITY: Low   GOALS: Goals reviewed with patient? Yes  SHORT TERM GOALS: Target date: 11/03/2023   Patient will increase right knee strength by 5 lbs  Baseline: Goal status: goal adjusted not tested 12/6  2.  Patient will demonstrate full knee extension Baseline:  Goal status: improving -2 12/6   3.  Patient will ambulate 500 feet without crutches without significant antalgic gait Baseline:  Goal status: working with cane   4.  Patient will be independent with base exercise program Baseline:  Goal status: INITIAL  5.   Patient will demonstrate a 3 cm reduction in knee edema Goal status: Initial  LONG TERM GOALS: Target date: 12/01/2023    Patient will go up and down 8 steps with reciprocal gait pattern Baseline:  Goal status: INITIAL  2.  Patient will ambulate community distances without pain Baseline:  Goal status: INITIAL  3.  Patient will return to jogging when cleared by MD Baseline:  Goal status: INITIAL  4.  Patient will be independent with complete exercise program Baseline:  Goal status: INITIAL  PLAN:  PT FREQUENCY: 2x/week  PT DURATION: 8 weeks  PLANNED INTERVENTIONS: 97110-Therapeutic exercises, 97530- Therapeutic activity, [id number]- Neuromuscular re-education, 97535- Self Care, [id number]- Manual therapy, [id number]- Gait training, [phone]- Aquatic Therapy, 97014- Electrical stimulation (unattended), 97035- Ultrasound, Patient/Family education, Stair training, Taping, Dry Needling, DME instructions, Cryotherapy, and Moist heat   PLAN FOR NEXT SESSION: Consider vasopneumatic device next visit if he has continued swelling.  Continue passive range of motion.  Work on straight leg raises if able.  Consider weightbearing exercises.  Consider gait training.   [facility] [place], Student-PT  This entire session was performed under direct supervision and direction of a licensed Estate agent . I have personally read, edited and approve of the note as written.  Lorayne Bender PT DPT 10/24/2023, 2:02 PM

## 2023-10-27 ENCOUNTER — Encounter (HOSPITAL_BASED_OUTPATIENT_CLINIC_OR_DEPARTMENT_OTHER): Payer: Self-pay | Admitting: Physical Therapy

## 2023-10-27 ENCOUNTER — Ambulatory Visit (HOSPITAL_BASED_OUTPATIENT_CLINIC_OR_DEPARTMENT_OTHER): Payer: BC Managed Care – PPO | Admitting: Physical Therapy

## 2023-10-27 DIAGNOSIS — R600 Localized edema: Secondary | ICD-10-CM

## 2023-10-27 DIAGNOSIS — R2689 Other abnormalities of gait and mobility: Secondary | ICD-10-CM

## 2023-10-27 DIAGNOSIS — M25561 Pain in right knee: Secondary | ICD-10-CM | POA: Diagnosis not present

## 2023-10-27 DIAGNOSIS — M25661 Stiffness of right knee, not elsewhere classified: Secondary | ICD-10-CM

## 2023-10-27 NOTE — Therapy (Signed)
OUTPATIENT PHYSICAL THERAPY LOWER EXTREMITY TREATMENT   Patient Name: Jorge Lee MRN: [id number] DOB:[birth year], 37 y.o., male Today's Date: 10/27/2023  END OF SESSION:  PT End of Session - 10/27/23 1023     Visit Number 6    Number of Visits 16    Date for PT Re-Evaluation 12/01/23    PT Start Time 1015    PT Stop Time 1058    PT Time Calculation (min) 43 min    Activity Tolerance Patient tolerated treatment well;No increased pain    Behavior During Therapy WFL for tasks assessed/performed              Past Medical History:  Diagnosis Date   Narcolepsy    PONV (postoperative nausea and vomiting)    Past Surgical History:  Procedure Laterality Date   KNEE ARTHROSCOPY     x2   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   Procedure: RIGHT  KNEE UNI ARTHROPLASTY;  Surgeon: Huel Cote, MD;  Location: [facility];  Service: Orthopedics;  Laterality: Right;   Patient Active Problem List   Diagnosis Date Noted   Unilateral primary osteoarthritis, right knee 10/03/2023   Baker's cyst of knee, right 09/09/2020   Osteochondritis dissecans of right knee 01/15/2020   Right foot drop 08/02/2019   Posterior right knee pain 12/05/2017    PCP: Leodis Sias MD  REFERRING PROVIDER: Dr Huel Cote   REFERRING DIAG: right UKA 10/03/2023  THERAPY DIAG:  Acute pain of right knee  Stiffness of right knee, not elsewhere classified  Localized edema  Other abnormalities of gait and mobility  Rationale for Evaluation and Treatment: [facility]   ONSET DATE: 10/03/2023  SUBJECTIVE:  Days since surgery: 24  SUBJECTIVE STATEMENT: The patient has progressed and progressing his exercises but feels like he is doing okay with it.  He is still having some trouble sleeping.  He reported minor pain and swelling after last visit.   Eval: Patient is a 37 year old male status post right unicompartmental knee replacement.  He has a long history of right knee  pain.  He is an avid runner.  His pain and been increasing to the point where he could not run.  At this time he is having significant pain and swelling particularly at night.  He was using crutches for primary ambulation.  PERTINENT HISTORY: No significant PMH PAIN:  Are you having pain? Yes: NPRS scale: 3/10 right now Pain location: right knee  Pain description: aching  Aggravating factors: night time  Relieving factors: pain meds   PRECAUTIONS: None  RED FLAGS: None   WEIGHT BEARING RESTRICTIONS: Yes WBAT  FALLS:  Has patient fallen in last 6 months? No  LIVING ENVIRONMENT: 10 steps inside   OCCUPATION:  Elementary school physical education teacher  Running:   PLOF: Independent  PATIENT GOALS: To have less pain and to return to running/return to work  NEXT MD VISIT:  Next week  OBJECTIVE:  Note: Objective measures were completed at Evaluation unless otherwise noted.  DIAGNOSTIC FINDINGS: Nothing postop  PATIENT SURVEYS:  FOTO 18% currently 62% expected in 20 visits  COGNITION: Overall cognitive status: Within functional limits for tasks assessed     SENSATION: WFL  EDEMA:  Circumferential: Right 49.4 left 41 cm    POSTURE: No Significant postural limitations  PALPATION:   LOWER EXTREMITY ROM:  Passive ROM Right eval Left eval  Right  12/4 Right  12/13  Hip flexion       Hip extension  Hip abduction       Hip adduction       Hip internal rotation       Hip external rotation       Knee flexion 38  85 100 115  Knee extension -6  -2    Ankle dorsiflexion       Ankle plantarflexion       Ankle inversion       Ankle eversion        (Blank rows = not tested)  LOWER EXTREMITY MMT:  MMT Right eval Left eval  Hip flexion    Hip extension    Hip abduction    Hip adduction    Hip internal rotation    Hip external rotation    Knee flexion    Knee extension    Ankle dorsiflexion    Ankle plantarflexion    Ankle inversion     Ankle eversion     (Blank rows = not tested) not taken today secondary to significant swelling   GAIT: Reviewed how to weight-bear with 2 crutches.  Decreased weightbearing on right side compared to left.  Decreased right hip flexion.  We also reviewed how to use single crutch.  Patient advised not to progress to single crutch until he feels comfortable confident with 2 crutches. TODAY'S TREATMENT:                                                                                                                              DATE:   12/13 SLR 3x10, 2.5# SAQ 3x10, 3 #  LAQ 3x10 3 lbs   Step ups 4 inch 2x10  Lateral step up 4 inch 2x10  Reviewed technique with mini squats 2 x 10  Manual passive range of motion into flexion and extension  Exercise bike 6 minutes able to make full revolutions without difficulty  12/10 Supine SLR 3x10, 2.5# SAQ 3x10, 2.5#  Seated LAQ 2x12, 2.5#  Standing   A/P weight shifts to calf raises x20  Hip abduction- bil 2x12 Manual  Knee PROM into flexion & ext; flexion & extn mobs  12/4 Nu-step 5 min for self stretch   Supine:   SLR 3x10  SAQ 3x10   Heel raise 2x10   Standing:   Last visit:  Manual: PROM into flexion and extension  Quad set 3x10  Nu-step 5 min for self stretch   SLR 3x10   Heel/toe 3x10   Cane training: reviewed proper fitting and technique with a cane.    Eval: Manual: Passive range of motion into flexion.   Exercises - Supine Heel Slide with Strap  - 1 x daily - 7 x weekly - 3 sets - 5 reps - 5 sec  hold - Supine Quad Set  - 1 x daily - 7 x weekly - 3 sets - 10 reps - Supine Knee Extension Stretch on Towel Roll  - 1 x daily - 7 x weekly -  3 sets - 5 reps - 5 sec  hold   PATIENT EDUCATION:  Education details: HEP, symptom management, edema management, progression of activities Person educated: Patient Education method: Explanation, Demonstration, Tactile cues, Verbal cues, and Handouts Education  comprehension: verbalized understanding, returned demonstration, verbal cues required, tactile cues required, and needs further education  HOME EXERCISE PROGRAM: Access Code: [email or web address] URL: [email or web address] Date: 10/06/2023 Prepared by: Lorayne Bender  ASSESSMENT:  CLINICAL IMPRESSION: Patient continues to make great progress.  His range of motion was measured at 0 to 115 degrees today.  We continue to advance his weights as tolerated..  Will advance his long arc quad weight to 3 pounds today.  He tolerated step ups well today.  We also worked on squatting technique today.  He was able to achieve a good hip hinge with minimal cueing.    Eval hold patient is a 37 year old male status post right unicompartmental knee replacement on 10/03/2023.  At this time he presents with expected limitations in knee range of motion, strength, ability to ambulate, and general functional mobility.  He has significant swelling at this time.  His range of motion is limited in flexion.  He would benefit from skilled therapy to return to active lifestyle.  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 OBJECTIVE IMPAIRMENTS: Abnormal gait, decreased activity tolerance, decreased endurance, decreased mobility, difficulty walking, decreased ROM, decreased strength, increased edema, and pain.   ACTIVITY LIMITATIONS: carrying, lifting, bending, standing, squatting, transfers, bed mobility, toileting, dressing, self feeding, and locomotion level  PARTICIPATION LIMITATIONS: meal prep, cleaning, laundry, driving, shopping, occupation, and yard work  PERSONAL FACTORS: None   REHAB POTENTIAL: Excellent  CLINICAL DECISION MAKING: Stable/uncomplicated  EVALUATION COMPLEXITY: Low   GOALS: Goals reviewed with patient? Yes  SHORT TERM GOALS: Target date: 11/03/2023   Patient will increase right knee strength by 5 lbs  Baseline: Goal status: goal adjusted not tested 12/6  2.   Patient will demonstrate full knee extension Baseline:  Goal status: improving -2 12/6   3.  Patient will ambulate 500 feet without crutches without significant antalgic gait Baseline:  Goal status: working with cane   4.  Patient will be independent with base exercise program Baseline:  Goal status: INITIAL  5.  Patient will demonstrate a 3 cm reduction in knee edema Goal status: Initial  LONG TERM GOALS: Target date: 12/01/2023    Patient will go up and down 8 steps with reciprocal gait pattern Baseline:  Goal status: INITIAL  2.  Patient will ambulate community distances without pain Baseline:  Goal status: INITIAL  3.  Patient will return to jogging when cleared by MD Baseline:  Goal status: INITIAL  4.  Patient will be independent with complete exercise program Baseline:  Goal status: INITIAL  PLAN:  PT FREQUENCY: 2x/week  PT DURATION: 8 weeks  PLANNED INTERVENTIONS: 97110-Therapeutic exercises, 97530- Therapeutic activity, [id number]- Neuromuscular re-education, 97535- Self Care, [id number]- Manual therapy, [id number]- Gait training, [phone]- Aquatic Therapy, 97014- Electrical stimulation (unattended), 97035- Ultrasound, Patient/Family education, Stair training, Taping, Dry Needling, DME instructions, Cryotherapy, and Moist heat   PLAN FOR NEXT SESSION: Consider vasopneumatic device next visit if he has continued swelling.  Continue passive range of motion.  Work on straight leg raises if able.  Consider weightbearing exercises.  Consider gait training.   [facility] [place], Student-PT  This entire session was performed under direct supervision and direction of a licensed Estate agent . I have personally read, edited and approve of the  note as written.  Lorayne Bender PT DPT 10/27/2023, 11:04 AM

## 2023-10-27 NOTE — Therapy (Deleted)
OUTPATIENT PHYSICAL THERAPY LOWER EXTREMITY TREATMENT   Patient Name: Jorge Lee MRN: [id number] DOB:11-12-1986, 37 y.o., male Today's Date: 10/27/2023  END OF SESSION:  PT End of Session - 10/27/23 1023     Visit Number 6    Number of Visits 16    Date for PT Re-Evaluation 12/01/23    PT Start Time 1015    PT Stop Time 1058    PT Time Calculation (min) 43 min    Activity Tolerance Patient tolerated treatment well;No increased pain    Behavior During Therapy WFL for tasks assessed/performed              Past Medical History:  Diagnosis Date   Narcolepsy    PONV (postoperative nausea and vomiting)    Past Surgical History:  Procedure Laterality Date   KNEE ARTHROSCOPY     x2   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   Procedure: RIGHT  KNEE UNI ARTHROPLASTY;  Surgeon: Huel Cote, MD;  Location: [facility];  Service: Orthopedics;  Laterality: Right;   Patient Active Problem List   Diagnosis Date Noted   Unilateral primary osteoarthritis, right knee 10/03/2023   Baker's cyst of knee, right 09/09/2020   Osteochondritis dissecans of right knee 01/15/2020   Right foot drop 08/02/2019   Posterior right knee pain 12/05/2017    PCP: Leodis Sias MD  REFERRING PROVIDER: Dr Huel Cote   REFERRING DIAG: right UKA 10/03/2023  THERAPY DIAG:  Acute pain of right knee  Stiffness of right knee, not elsewhere classified  Localized edema  Other abnormalities of gait and mobility  Rationale for Evaluation and Treatment: [facility]   ONSET DATE: 10/03/2023  SUBJECTIVE:   SUBJECTIVE STATEMENT: Pt states he is sleeping better at night with less knee pain.    Eval: Patient is a 37 year old male status post right unicompartmental knee replacement.  He has a long history of right knee pain.  He is an avid runner.  His pain and been increasing to the point where he could not run.  At this time he is having significant pain and swelling  particularly at night.  He was using crutches for primary ambulation.  PERTINENT HISTORY: No significant PMH PAIN:  Are you having pain? Yes: NPRS scale: 1 out of 10 right now Pain location: right knee  Pain description: aching  Aggravating factors: night time  Relieving factors: pain meds   PRECAUTIONS: None  RED FLAGS: None   WEIGHT BEARING RESTRICTIONS: Yes WBAT  FALLS:  Has patient fallen in last 6 months? No  LIVING ENVIRONMENT: 10 steps inside   OCCUPATION:  Elementary school physical education teacher  Running:   PLOF: Independent  PATIENT GOALS: To have less pain and to return to running/return to work  NEXT MD VISIT:  Next week  OBJECTIVE:  Note: Objective measures were completed at Evaluation unless otherwise noted.  DIAGNOSTIC FINDINGS: Nothing postop  PATIENT SURVEYS:  FOTO 18% currently 62% expected in 20 visits  COGNITION: Overall cognitive status: Within functional limits for tasks assessed     SENSATION: WFL  EDEMA:  Circumferential: Right 49.4 left 41 cm    POSTURE: No Significant postural limitations  PALPATION:   LOWER EXTREMITY ROM:  Passive ROM Right eval Left eval  Right  12/4  Hip flexion      Hip extension      Hip abduction      Hip adduction      Hip internal rotation      Hip  external rotation      Knee flexion 38  85 100  Knee extension -6  -2   Ankle dorsiflexion      Ankle plantarflexion      Ankle inversion      Ankle eversion       (Blank rows = not tested)  LOWER EXTREMITY MMT:  MMT Right eval Left eval  Hip flexion    Hip extension    Hip abduction    Hip adduction    Hip internal rotation    Hip external rotation    Knee flexion    Knee extension    Ankle dorsiflexion    Ankle plantarflexion    Ankle inversion    Ankle eversion     (Blank rows = not tested) not taken today secondary to significant swelling   GAIT: Reviewed how to weight-bear with 2 crutches.  Decreased weightbearing  on right side compared to left.  Decreased right hip flexion.  We also reviewed how to use single crutch.  Patient advised not to progress to single crutch until he feels comfortable confident with 2 crutches. TODAY'S TREATMENT:                                                                                                                              DATE:   12/13 SLR 3x10, 2.5# SAQ 3x10, 3 #  LAQ 3x10 3 lbs   Step ups 4 inch 2x10  Lateral step up 4 inch 2x10   12/10 Supine SLR 3x10, 2.5# SAQ 3x10, 2.5#  Seated LAQ 2x12, 2.5#  Standing   A/P weight shifts to calf raises x20  Hip abduction- bil 2x12 Manual  Knee PROM into flexion & ext; flexion & extn mobs  12/4 Nu-step 5 min for self stretch   Supine:   SLR 3x10  SAQ 3x10   Heel raise 2x10   Standing:   Last visit:  Manual: PROM into flexion and extension  Quad set 3x10  Nu-step 5 min for self stretch   SLR 3x10   Heel/toe 3x10   Cane training: reviewed proper fitting and technique with a cane.    Eval: Manual: Passive range of motion into flexion.   Exercises - Supine Heel Slide with Strap  - 1 x daily - 7 x weekly - 3 sets - 5 reps - 5 sec  hold - Supine Quad Set  - 1 x daily - 7 x weekly - 3 sets - 10 reps - Supine Knee Extension Stretch on Towel Roll  - 1 x daily - 7 x weekly - 3 sets - 5 reps - 5 sec  hold   PATIENT EDUCATION:  Education details: HEP, symptom management, edema management, progression of activities Person educated: Patient Education method: Explanation, Demonstration, Tactile cues, Verbal cues, and Handouts Education comprehension: verbalized understanding, returned demonstration, verbal cues required, tactile cues required, and needs further education  HOME EXERCISE PROGRAM: Access Code: [email or web address] URL: [email or web address]  Date: 10/06/2023 Prepared by: Lorayne Bender  ASSESSMENT:  CLINICAL IMPRESSION: Session focused on ther ex to promote global LE  strengthening. Pt able to progress to using weights for several interventions and progressed CKC LE strengthening today without increased pain and minimal increased fatigue. Pt able to increase passive knee flexion to 112 today. Pt continues to have difficulty with R knee ROM and LE muscular strength secondary to R knee edema, pain, and muscular strength -although steady progress is being made. Continue to progress LE strength and ROM as tolerated.     Eval hold patient is a 37 year old male status post right unicompartmental knee replacement on 10/03/2023.  At this time he presents with expected limitations in knee range of motion, strength, ability to ambulate, and general functional mobility.  He has significant swelling at this time.  His range of motion is limited in flexion.  He would benefit from skilled therapy to return to active lifestyle.  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 OBJECTIVE IMPAIRMENTS: Abnormal gait, decreased activity tolerance, decreased endurance, decreased mobility, difficulty walking, decreased ROM, decreased strength, increased edema, and pain.   ACTIVITY LIMITATIONS: carrying, lifting, bending, standing, squatting, transfers, bed mobility, toileting, dressing, self feeding, and locomotion level  PARTICIPATION LIMITATIONS: meal prep, cleaning, laundry, driving, shopping, occupation, and yard work  PERSONAL FACTORS: None   REHAB POTENTIAL: Excellent  CLINICAL DECISION MAKING: Stable/uncomplicated  EVALUATION COMPLEXITY: Low   GOALS: Goals reviewed with patient? Yes  SHORT TERM GOALS: Target date: 11/03/2023   Patient will increase right knee strength by 5 lbs  Baseline: Goal status: goal adjusted not tested 12/6  2.  Patient will demonstrate full knee extension Baseline:  Goal status: improving -2 12/6   3.  Patient will ambulate 500 feet without crutches without significant antalgic gait Baseline:  Goal status: working  with cane   4.  Patient will be independent with base exercise program Baseline:  Goal status: INITIAL  5.  Patient will demonstrate a 3 cm reduction in knee edema Goal status: Initial  LONG TERM GOALS: Target date: 12/01/2023    Patient will go up and down 8 steps with reciprocal gait pattern Baseline:  Goal status: INITIAL  2.  Patient will ambulate community distances without pain Baseline:  Goal status: INITIAL  3.  Patient will return to jogging when cleared by MD Baseline:  Goal status: INITIAL  4.  Patient will be independent with complete exercise program Baseline:  Goal status: INITIAL  PLAN:  PT FREQUENCY: 2x/week  PT DURATION: 8 weeks  PLANNED INTERVENTIONS: 97110-Therapeutic exercises, 97530- Therapeutic activity, [id number]- Neuromuscular re-education, 97535- Self Care, [id number]- Manual therapy, [id number]- Gait training, [phone]- Aquatic Therapy, 97014- Electrical stimulation (unattended), 97035- Ultrasound, Patient/Family education, Stair training, Taping, Dry Needling, DME instructions, Cryotherapy, and Moist heat   PLAN FOR NEXT SESSION: Consider vasopneumatic device next visit if he has continued swelling.  Continue passive range of motion.  Work on straight leg raises if able.  Consider weightbearing exercises.  Consider gait training.   [facility], Student-PT  This entire session was performed under direct supervision and direction of a licensed Estate agent . I have personally read, edited and approve of the note as written.  Lorayne Bender PT DPT 10/27/2023, 10:34 AM

## 2023-10-31 ENCOUNTER — Encounter (HOSPITAL_BASED_OUTPATIENT_CLINIC_OR_DEPARTMENT_OTHER): Payer: Self-pay | Admitting: Physical Therapy

## 2023-10-31 ENCOUNTER — Ambulatory Visit (HOSPITAL_BASED_OUTPATIENT_CLINIC_OR_DEPARTMENT_OTHER): Payer: BC Managed Care – PPO | Admitting: Physical Therapy

## 2023-10-31 ENCOUNTER — Encounter (HOSPITAL_BASED_OUTPATIENT_CLINIC_OR_DEPARTMENT_OTHER): Payer: Self-pay | Admitting: Orthopaedic Surgery

## 2023-10-31 DIAGNOSIS — R600 Localized edema: Secondary | ICD-10-CM

## 2023-10-31 DIAGNOSIS — M25561 Pain in right knee: Secondary | ICD-10-CM

## 2023-10-31 DIAGNOSIS — R2689 Other abnormalities of gait and mobility: Secondary | ICD-10-CM

## 2023-10-31 DIAGNOSIS — M25661 Stiffness of right knee, not elsewhere classified: Secondary | ICD-10-CM

## 2023-10-31 NOTE — Therapy (Signed)
OUTPATIENT PHYSICAL THERAPY LOWER EXTREMITY TREATMENT   Patient Name: Jorge Lee MRN: [id number] DOB:[birth year], 37 y.o., male Today's Date: 10/31/2023  END OF SESSION:  PT End of Session - 10/31/23 1020     Visit Number 7    Number of Visits 16    Date for PT Re-Evaluation 12/01/23    PT Start Time 1015    PT Stop Time 1057    PT Time Calculation (min) 42 min    Activity Tolerance Patient tolerated treatment well    Behavior During Therapy WFL for tasks assessed/performed              Past Medical History:  Diagnosis Date   Narcolepsy    PONV (postoperative nausea and vomiting)    Past Surgical History:  Procedure Laterality Date   KNEE ARTHROSCOPY     x2   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   Procedure: RIGHT  KNEE UNI ARTHROPLASTY;  Surgeon: Huel Cote, MD;  Location: [facility];  Service: Orthopedics;  Laterality: Right;   Patient Active Problem List   Diagnosis Date Noted   Unilateral primary osteoarthritis, right knee 10/03/2023   Baker's cyst of knee, right 09/09/2020   Osteochondritis dissecans of right knee 01/15/2020   Right foot drop 08/02/2019   Posterior right knee pain 12/05/2017    PCP: Leodis Sias MD  REFERRING PROVIDER: Dr Huel Cote   REFERRING DIAG: right UKA 10/03/2023  THERAPY DIAG:  Acute pain of right knee  Stiffness of right knee, not elsewhere classified  Localized edema  Other abnormalities of gait and mobility  Rationale for Evaluation and Treatment: [facility]   ONSET DATE: 10/03/2023  SUBJECTIVE:  Days since surgery: 28  SUBJECTIVE STATEMENT: The patient has been biking. He feels good. He had a sharp pain in his lateral knee yesterday but it is better today. He slep t well last nigh.  Eval: Patient is a 37 year old male status post right unicompartmental knee replacement.  He has a long history of right knee pain.  He is an avid runner.  His pain and been increasing to the point  where he could not run.  At this time he is having significant pain and swelling particularly at night.  He was using crutches for primary ambulation.  PERTINENT HISTORY: No significant PMH PAIN:  Are you having pain? Yes: NPRS scale: 3/10 right now Pain location: right knee  Pain description: aching  Aggravating factors: night time  Relieving factors: pain meds   PRECAUTIONS: None  RED FLAGS: None   WEIGHT BEARING RESTRICTIONS: Yes WBAT  FALLS:  Has patient fallen in last 6 months? No  LIVING ENVIRONMENT: 10 steps inside   OCCUPATION:  Elementary school physical education teacher  Running:   PLOF: Independent  PATIENT GOALS: To have less pain and to return to running/return to work  NEXT MD VISIT:  Next week  OBJECTIVE:  Note: Objective measures were completed at Evaluation unless otherwise noted.  DIAGNOSTIC FINDINGS: Nothing postop  PATIENT SURVEYS:  FOTO 18% currently 62% expected in 20 visits  COGNITION: Overall cognitive status: Within functional limits for tasks assessed     SENSATION: WFL  EDEMA:  Circumferential: Right 49.4 left 41 cm    POSTURE: No Significant postural limitations  PALPATION:   LOWER EXTREMITY ROM:  Passive ROM Right eval Left eval  Right  12/4 Right  12/13  Hip flexion       Hip extension       Hip abduction  Hip adduction       Hip internal rotation       Hip external rotation       Knee flexion 38  85 100 115  Knee extension -6  -2    Ankle dorsiflexion       Ankle plantarflexion       Ankle inversion       Ankle eversion        (Blank rows = not tested)  LOWER EXTREMITY MMT:  MMT Right eval Left eval  Hip flexion    Hip extension    Hip abduction    Hip adduction    Hip internal rotation    Hip external rotation    Knee flexion    Knee extension    Ankle dorsiflexion    Ankle plantarflexion    Ankle inversion    Ankle eversion     (Blank rows = not tested) not taken today secondary to  significant swelling   GAIT: Reviewed how to weight-bear with 2 crutches.  Decreased weightbearing on right side compared to left.  Decreased right hip flexion.  We also reviewed how to use single crutch.  Patient advised not to progress to single crutch until he feels comfortable confident with 2 crutches. TODAY'S TREATMENT:                                                                                                                              DATE:  12/17 SLR 3x10, 2.5# SAQ 3x10, 4 #  LAQ 3x10 4 lbs   Step ups 6 inch 2x10  Exercise bike 5 min   Leg press 90 lbs 3x15 using RPE    12/13 SLR 3x10, 2.5# SAQ 3x10, 3 #  LAQ 3x10 3 lbs   Step ups 4 inch 2x10  Lateral step up 4 inch 2x10  Reviewed technique with mini squats 2 x 10  Manual passive range of motion into flexion and extension  Exercise bike 6 minutes able to make full revolutions without difficulty  12/10 Supine SLR 3x10, 2.5# SAQ 3x10, 2.5#  Seated LAQ 2x12, 2.5#  Standing   A/P weight shifts to calf raises x20  Hip abduction- bil 2x12 Manual  Knee PROM into flexion & ext; flexion & extn mobs  12/4 Nu-step 5 min for self stretch   Supine:   SLR 3x10  SAQ 3x10   Heel raise 2x10   Standing:   Last visit:  Manual: PROM into flexion and extension  Quad set 3x10  Nu-step 5 min for self stretch   SLR 3x10   Heel/toe 3x10   Cane training: reviewed proper fitting and technique with a cane.    Eval: Manual: Passive range of motion into flexion.   Exercises - Supine Heel Slide with Strap  - 1 x daily - 7 x weekly - 3 sets - 5 reps - 5 sec  hold - Supine Quad Set  - 1 x daily -  7 x weekly - 3 sets - 10 reps - Supine Knee Extension Stretch on Towel Roll  - 1 x daily - 7 x weekly - 3 sets - 5 reps - 5 sec  hold   PATIENT EDUCATION:  Education details: HEP, symptom management, edema management, progression of activities Person educated: Patient Education method: Explanation,  Demonstration, Tactile cues, Verbal cues, and Handouts Education comprehension: verbalized understanding, returned demonstration, verbal cues required, tactile cues required, and needs further education  HOME EXERCISE PROGRAM: Access Code: [email or web address] URL: [email or web address] Date: 10/06/2023 Prepared by: Lorayne Bender  ASSESSMENT:  CLINICAL IMPRESSION: The patient continues to progress well. He was lacking  3 degrees of extension. We worked on manual therapy for extension. Therapy added in a log press today. He tolerated well. We will continue to progress as tolerated.    Eval hold patient is a 37 year old male status post right unicompartmental knee replacement on 10/03/2023.  At this time he presents with expected limitations in knee range of motion, strength, ability to ambulate, and general functional mobility.  He has significant swelling at this time.  His range of motion is limited in flexion.  He would benefit from skilled therapy to return to active lifestyle.  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 OBJECTIVE IMPAIRMENTS: Abnormal gait, decreased activity tolerance, decreased endurance, decreased mobility, difficulty walking, decreased ROM, decreased strength, increased edema, and pain.   ACTIVITY LIMITATIONS: carrying, lifting, bending, standing, squatting, transfers, bed mobility, toileting, dressing, self feeding, and locomotion level  PARTICIPATION LIMITATIONS: meal prep, cleaning, laundry, driving, shopping, occupation, and yard work  PERSONAL FACTORS: None   REHAB POTENTIAL: Excellent  CLINICAL DECISION MAKING: Stable/uncomplicated  EVALUATION COMPLEXITY: Low   GOALS: Goals reviewed with patient? Yes  SHORT TERM GOALS: Target date: 11/03/2023   Patient will increase right knee strength by 5 lbs  Baseline: Goal status: goal adjusted not tested 12/6  2.  Patient will demonstrate full knee extension Baseline:  Goal  status: improving -2 12/6   3.  Patient will ambulate 500 feet without crutches without significant antalgic gait Baseline:  Goal status: working with cane   4.  Patient will be independent with base exercise program Baseline:  Goal status: INITIAL  5.  Patient will demonstrate a 3 cm reduction in knee edema Goal status: Initial  LONG TERM GOALS: Target date: 12/01/2023    Patient will go up and down 8 steps with reciprocal gait pattern Baseline:  Goal status: INITIAL  2.  Patient will ambulate community distances without pain Baseline:  Goal status: INITIAL  3.  Patient will return to jogging when cleared by MD Baseline:  Goal status: INITIAL  4.  Patient will be independent with complete exercise program Baseline:  Goal status: INITIAL  PLAN:  PT FREQUENCY: 2x/week  PT DURATION: 8 weeks  PLANNED INTERVENTIONS: 97110-Therapeutic exercises, 97530- Therapeutic activity, [id number]- Neuromuscular re-education, 97535- Self Care, 10272- Manual therapy, [id number]- Gait training, [phone]- Aquatic Therapy, 97014- Electrical stimulation (unattended), 97035- Ultrasound, Patient/Family education, Stair training, Taping, Dry Needling, DME instructions, Cryotherapy, and Moist heat   PLAN FOR NEXT SESSION: Consider vasopneumatic device next visit if he has continued swelling.  Continue passive range of motion.  Work on straight leg raises if able.  Consider weightbearing exercises.  Consider gait training.   [facility] [place], Student-PT  This entire session was performed under direct supervision and direction of a licensed Estate agent . I have personally read, edited and approve of the note as written.  Lorayne Bender PT DPT 10/31/2023, 2:20 PM

## 2023-11-03 ENCOUNTER — Ambulatory Visit (HOSPITAL_BASED_OUTPATIENT_CLINIC_OR_DEPARTMENT_OTHER): Payer: BC Managed Care – PPO | Admitting: Physical Therapy

## 2023-11-03 ENCOUNTER — Encounter (HOSPITAL_BASED_OUTPATIENT_CLINIC_OR_DEPARTMENT_OTHER): Payer: Self-pay | Admitting: Physical Therapy

## 2023-11-03 DIAGNOSIS — M25561 Pain in right knee: Secondary | ICD-10-CM | POA: Diagnosis not present

## 2023-11-03 DIAGNOSIS — R600 Localized edema: Secondary | ICD-10-CM

## 2023-11-03 DIAGNOSIS — M25661 Stiffness of right knee, not elsewhere classified: Secondary | ICD-10-CM

## 2023-11-03 DIAGNOSIS — R2689 Other abnormalities of gait and mobility: Secondary | ICD-10-CM

## 2023-11-03 NOTE — Therapy (Signed)
OUTPATIENT PHYSICAL THERAPY LOWER EXTREMITY TREATMENT   Patient Name: Jorge Lee MRN: [id number] DOB:08-21-1986, 37 y.o., male Today's Date: 11/03/2023  END OF SESSION:  PT End of Session - 11/03/23 1049     Visit Number 8    Number of Visits 16    Date for PT Re-Evaluation 12/01/23    PT Start Time 1015    PT Stop Time 1058    PT Time Calculation (min) 43 min    Activity Tolerance Patient tolerated treatment well    Behavior During Therapy WFL for tasks assessed/performed              Past Medical History:  Diagnosis Date   Narcolepsy    PONV (postoperative nausea and vomiting)    Past Surgical History:  Procedure Laterality Date   KNEE ARTHROSCOPY     x2   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   Procedure: RIGHT  KNEE UNI ARTHROPLASTY;  Surgeon: Huel Cote, MD;  Location: [facility];  Service: Orthopedics;  Laterality: Right;   Patient Active Problem List   Diagnosis Date Noted   Unilateral primary osteoarthritis, right knee 10/03/2023   Baker's cyst of knee, right 09/09/2020   Osteochondritis dissecans of right knee 01/15/2020   Right foot drop 08/02/2019   Posterior right knee pain 12/05/2017    PCP: Leodis Sias MD  REFERRING PROVIDER: Dr Huel Cote   REFERRING DIAG: right UKA 10/03/2023  THERAPY DIAG:  No diagnosis found.  Rationale for Evaluation and Treatment: [facility]   ONSET DATE: 10/03/2023  SUBJECTIVE:  Days since surgery: 31  SUBJECTIVE STATEMENT: The patient has been biking. He feels good. He had a sharp pain in his lateral knee yesterday but it is better today. He slep t well last nigh.  Eval: Patient is a 37 year old male status post right unicompartmental knee replacement.  He has a long history of right knee pain.  He is an avid runner.  His pain and been increasing to the point where he could not run.  At this time he is having significant pain and swelling particularly at night.  He was using  crutches for primary ambulation.  PERTINENT HISTORY: No significant PMH PAIN:  Are you having pain? Yes: NPRS scale: 3/10 right now Pain location: right knee  Pain description: aching  Aggravating factors: night time  Relieving factors: pain meds   PRECAUTIONS: None  RED FLAGS: None   WEIGHT BEARING RESTRICTIONS: Yes WBAT  FALLS:  Has patient fallen in last 6 months? No  LIVING ENVIRONMENT: 10 steps inside   OCCUPATION:  Elementary school physical education teacher  Running:   PLOF: Independent  PATIENT GOALS: To have less pain and to return to running/return to work  NEXT MD VISIT:  Next week  OBJECTIVE:  Note: Objective measures were completed at Evaluation unless otherwise noted.  DIAGNOSTIC FINDINGS: Nothing postop  PATIENT SURVEYS:  FOTO 18% currently 62% expected in 20 visits  COGNITION: Overall cognitive status: Within functional limits for tasks assessed     SENSATION: WFL  EDEMA:  Circumferential: Right 49.4 left 41 cm    POSTURE: No Significant postural limitations  PALPATION:   LOWER EXTREMITY ROM:  Passive ROM Right eval Left eval  Right  12/4 Right  12/13  Hip flexion       Hip extension       Hip abduction       Hip adduction       Hip internal rotation  Hip external rotation       Knee flexion 38  85 100 115  Knee extension -6  -2    Ankle dorsiflexion       Ankle plantarflexion       Ankle inversion       Ankle eversion        (Blank rows = not tested)  LOWER EXTREMITY MMT:  MMT Right eval Left eval  Hip flexion    Hip extension    Hip abduction    Hip adduction    Hip internal rotation    Hip external rotation    Knee flexion    Knee extension    Ankle dorsiflexion    Ankle plantarflexion    Ankle inversion    Ankle eversion     (Blank rows = not tested) not taken today secondary to significant swelling   GAIT: Reviewed how to weight-bear with 2 crutches.  Decreased weightbearing on right side  compared to left.  Decreased right hip flexion.  We also reviewed how to use single crutch.  Patient advised not to progress to single crutch until he feels comfortable confident with 2 crutches. TODAY'S TREATMENT:                                                                                                                              DATE:  12/20 Manual passive range of motion into flexion and extension  SLR 3x10,  4 lbs 3x10            LAQ 5 lbs 3x15    TKE with cable 10 lbs 3x15  Cable walk x10 20 lbs   Reviewed and updated HEP   12/17 SLR 3x10, 2.5# SAQ 3x10, 4 #  LAQ 3x10 4 lbs   Step ups 6 inch 2x10  Exercise bike 5 min   Leg press 90 lbs 3x15 using RPE    12/13 SLR 3x10, 2.5# SAQ 3x10, 3 #  LAQ 3x10 3 lbs   Step ups 4 inch 2x10  Lateral step up 4 inch 2x10  Reviewed technique with mini squats 2 x 10  Manual passive range of motion into flexion and extension  Exercise bike 6 minutes able to make full revolutions without difficulty  12/10 Supine SLR 3x10, 2.5# SAQ 3x10, 2.5#  Seated LAQ 2x12, 2.5#  Standing   A/P weight shifts to calf raises x20  Hip abduction- bil 2x12 Manual  Knee PROM into flexion & ext; flexion & extn mobs  12/4 Nu-step 5 min for self stretch   Supine:   SLR 3x10  SAQ 3x10   Heel raise 2x10   Standing:   Last visit:  Manual: PROM into flexion and extension  Quad set 3x10  Nu-step 5 min for self stretch   SLR 3x10   Heel/toe 3x10   Cane training: reviewed proper fitting and technique with a cane.    Eval: Manual: Passive range of motion into flexion.  Exercises - Supine Heel Slide with Strap  - 1 x daily - 7 x weekly - 3 sets - 5 reps - 5 sec  hold - Supine Quad Set  - 1 x daily - 7 x weekly - 3 sets - 10 reps - Supine Knee Extension Stretch on Towel Roll  - 1 x daily - 7 x weekly - 3 sets - 5 reps - 5 sec  hold   PATIENT EDUCATION:  Education details: HEP, symptom management, edema management,  progression of activities Person educated: Patient Education method: Explanation, Demonstration, Tactile cues, Verbal cues, and Handouts Education comprehension: verbalized understanding, returned demonstration, verbal cues required, tactile cues required, and needs further education  HOME EXERCISE PROGRAM: Access Code: [email or web address] URL: [email or web address] Date: 10/06/2023 Prepared by: Lorayne Bender  ASSESSMENT:  CLINICAL IMPRESSION: The patient tolerated treatment well.  He reports he is having minor buckling feeling when he goes to terminal knee extension.  We worked on [employer] on the cable today he was given a band to do TKAs at home.  We also worked on Health and safety inspector for eccentric loading.  He tolerated well.  He had no significant increase in pain.  Therapy will continue to progress as tolerated.  See below for goal specific progress  Eval hold patient is a 37 year old male status post right unicompartmental knee replacement on 10/03/2023.  At this time he presents with expected limitations in knee range of motion, strength, ability to ambulate, and general functional mobility.  He has significant swelling at this time.  His range of motion is limited in flexion.  He would benefit from skilled therapy to return to active lifestyle.  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 OBJECTIVE IMPAIRMENTS: Abnormal gait, decreased activity tolerance, decreased endurance, decreased mobility, difficulty walking, decreased ROM, decreased strength, increased edema, and pain.   ACTIVITY LIMITATIONS: carrying, lifting, bending, standing, squatting, transfers, bed mobility, toileting, dressing, self feeding, and locomotion level  PARTICIPATION LIMITATIONS: meal prep, cleaning, laundry, driving, shopping, occupation, and yard work  PERSONAL FACTORS: None   REHAB POTENTIAL: Excellent  CLINICAL DECISION MAKING: Stable/uncomplicated  EVALUATION COMPLEXITY:  Low   GOALS: Goals reviewed with patient? Yes  SHORT TERM GOALS: Target date: 11/03/2023   Patient will increase right knee strength by 5 lbs  Baseline: Goal status: goal adjusted not tested 12/6  2.  Patient will demonstrate full knee extension Baseline:  Goal status: improving -2 12/6   3.  Patient will ambulate 500 feet without crutches without significant antalgic gait Baseline:  Goal status: working with cane   4.  Patient will be independent with base exercise program Baseline:  Goal status: Has base exercise program 12/20  5.  Patient will demonstrate a 3 cm reduction in knee edema Goal status: Initial  LONG TERM GOALS: Target date: 12/01/2023    Patient will go up and down 8 steps with reciprocal gait pattern Baseline:  Goal status: INITIAL  2.  Patient will ambulate community distances without pain Baseline:  Goal status: INITIAL  3.  Patient will return to jogging when cleared by MD Baseline:  Goal status: INITIAL  4.  Patient will be independent with complete exercise program Baseline:  Goal status: INITIAL  PLAN:  PT FREQUENCY: 2x/week  PT DURATION: 8 weeks  PLANNED INTERVENTIONS: 97110-Therapeutic exercises, 97530- Therapeutic activity, [id number]- Neuromuscular re-education, 97535- Self Care, 54098- Manual therapy, [id number]- Gait training, [phone]- Aquatic Therapy, 97014- Electrical stimulation (unattended), [phone]- Ultrasound, Patient/Family education, Stair training, Taping, Dry Needling,  DME instructions, Cryotherapy, and Moist heat   PLAN FOR NEXT SESSION: Consider vasopneumatic device next visit if he has continued swelling.  Continue passive range of motion.  Work on straight leg raises if able.  Consider weightbearing exercises.  Consider gait training.   [facility], Student-PT  This entire session was performed under direct supervision and direction of a licensed Estate agent . I have personally read, edited and approve of  the note as written.  Lorayne Bender PT DPT 11/03/2023, 11:26 AM

## 2023-11-09 ENCOUNTER — Ambulatory Visit (HOSPITAL_BASED_OUTPATIENT_CLINIC_OR_DEPARTMENT_OTHER): Payer: BC Managed Care – PPO | Admitting: Physical Therapy

## 2023-11-09 ENCOUNTER — Encounter (HOSPITAL_BASED_OUTPATIENT_CLINIC_OR_DEPARTMENT_OTHER): Payer: Self-pay | Admitting: Physical Therapy

## 2023-11-09 DIAGNOSIS — M25661 Stiffness of right knee, not elsewhere classified: Secondary | ICD-10-CM

## 2023-11-09 DIAGNOSIS — R600 Localized edema: Secondary | ICD-10-CM

## 2023-11-09 DIAGNOSIS — R2689 Other abnormalities of gait and mobility: Secondary | ICD-10-CM

## 2023-11-09 DIAGNOSIS — M25561 Pain in right knee: Secondary | ICD-10-CM | POA: Diagnosis not present

## 2023-11-09 NOTE — Therapy (Signed)
OUTPATIENT PHYSICAL THERAPY LOWER EXTREMITY TREATMENT   Patient Name: Jorge Lee MRN: [id number] DOB:1986/06/19, 37 y.o., male Today's Date: 11/09/2023  END OF SESSION:  PT End of Session - 11/09/23 1018     Visit Number 9    Number of Visits 16    Date for PT Re-Evaluation 12/01/23    PT Start Time 1018    PT Stop Time 1100    PT Time Calculation (min) 42 min    Activity Tolerance Patient tolerated treatment well    Behavior During Therapy WFL for tasks assessed/performed              Past Medical History:  Diagnosis Date   Narcolepsy    PONV (postoperative nausea and vomiting)    Past Surgical History:  Procedure Laterality Date   KNEE ARTHROSCOPY     x2   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   Procedure: RIGHT  KNEE UNI ARTHROPLASTY;  Surgeon: Huel Cote, MD;  Location: [facility];  Service: Orthopedics;  Laterality: Right;   Patient Active Problem List   Diagnosis Date Noted   Unilateral primary osteoarthritis, right knee 10/03/2023   Baker's cyst of knee, right 09/09/2020   Osteochondritis dissecans of right knee 01/15/2020   Right foot drop 08/02/2019   Posterior right knee pain 12/05/2017    PCP: Leodis Sias MD  REFERRING PROVIDER: Dr Huel Cote   REFERRING DIAG: right UKA 10/03/2023  THERAPY DIAG:  Acute pain of right knee  Stiffness of right knee, not elsewhere classified  Localized edema  Other abnormalities of gait and mobility  Rationale for Evaluation and Treatment: [facility]   ONSET DATE: 10/03/2023  SUBJECTIVE:  Days since surgery: 37  SUBJECTIVE STATEMENT: Patient states everything going well. HEP going well. Trouble with stairs and inclines.   Eval: Patient is a 37 year old male status post right unicompartmental knee replacement.  He has a long history of right knee pain.  He is an avid runner.  His pain and been increasing to the point where he could not run.  At this time he is having  significant pain and swelling particularly at night.  He was using crutches for primary ambulation.  PERTINENT HISTORY: No significant PMH PAIN:  Are you having pain? Yes: NPRS scale: 0/10 right now Pain location: right knee  Pain description: aching  Aggravating factors: night time  Relieving factors: pain meds   PRECAUTIONS: None  RED FLAGS: None   WEIGHT BEARING RESTRICTIONS: Yes WBAT  FALLS:  Has patient fallen in last 6 months? No  LIVING ENVIRONMENT: 10 steps inside   OCCUPATION:  Elementary school physical education teacher  Running:   PLOF: Independent  PATIENT GOALS: To have less pain and to return to running/return to work  NEXT MD VISIT:  Next week  OBJECTIVE:  Note: Objective measures were completed at Evaluation unless otherwise noted.  DIAGNOSTIC FINDINGS: Nothing postop  PATIENT SURVEYS:  FOTO 18% currently 62% expected in 20 visits  COGNITION: Overall cognitive status: Within functional limits for tasks assessed     SENSATION: WFL  EDEMA:  Circumferential: Right 49.4 left 41 cm    POSTURE: No Significant postural limitations  PALPATION:   LOWER EXTREMITY ROM:  Passive ROM Right eval Left eval  Right  12/4 Right  12/13 Right 11/09/23  Hip flexion        Hip extension        Hip abduction        Hip adduction  Hip internal rotation        Hip external rotation        Knee flexion 38  85 100 115 121  Knee extension -6  -2     Ankle dorsiflexion        Ankle plantarflexion        Ankle inversion        Ankle eversion         (Blank rows = not tested)  LOWER EXTREMITY MMT:  MMT Right eval Left eval  Hip flexion    Hip extension    Hip abduction    Hip adduction    Hip internal rotation    Hip external rotation    Knee flexion    Knee extension    Ankle dorsiflexion    Ankle plantarflexion    Ankle inversion    Ankle eversion     (Blank rows = not tested) not taken today secondary to significant  swelling   GAIT: Reviewed how to weight-bear with 2 crutches.  Decreased weightbearing on right side compared to left.  Decreased right hip flexion.  We also reviewed how to use single crutch.  Patient advised not to progress to single crutch until he feels comfortable confident with 2 crutches. TODAY'S TREATMENT:                                                                                                                              DATE:  11/09/23 Bike level 10, 5 minutes for dynamic warm up Step up 6 inch 2 x 10  Lateral step down 4 inch 3 x 10  Lunge 2 x 10 SLS on airex 3 x 30 second holds Manual: retrograde edema massage, scar mobilizations STS with RLE bias 1 x 10  12/20 Manual passive range of motion into flexion and extension  SLR 3x10,  4 lbs 3x10            LAQ 5 lbs 3x15    TKE with cable 10 lbs 3x15  Cable walk x10 20 lbs   Reviewed and updated HEP   12/17 SLR 3x10, 2.5# SAQ 3x10, 4 #  LAQ 3x10 4 lbs   Step ups 6 inch 2x10  Exercise bike 5 min   Leg press 90 lbs 3x15 using RPE    12/13 SLR 3x10, 2.5# SAQ 3x10, 3 #  LAQ 3x10 3 lbs   Step ups 4 inch 2x10  Lateral step up 4 inch 2x10  Reviewed technique with mini squats 2 x 10  Manual passive range of motion into flexion and extension  Exercise bike 6 minutes able to make full revolutions without difficulty  12/10 Supine SLR 3x10, 2.5# SAQ 3x10, 2.5#  Seated LAQ 2x12, 2.5#  Standing   A/P weight shifts to calf raises x20  Hip abduction- bil 2x12 Manual  Knee PROM into flexion & ext; flexion & extn mobs  12/4 Nu-step 5 min for self stretch  Supine:   SLR 3x10  SAQ 3x10   Heel raise 2x10   Standing:   Last visit:  Manual: PROM into flexion and extension  Quad set 3x10  Nu-step 5 min for self stretch   SLR 3x10   Heel/toe 3x10   Cane training: reviewed proper fitting and technique with a cane.    Eval: Manual: Passive range of motion into  flexion.   Exercises - Supine Heel Slide with Strap  - 1 x daily - 7 x weekly - 3 sets - 5 reps - 5 sec  hold - Supine Quad Set  - 1 x daily - 7 x weekly - 3 sets - 10 reps - Supine Knee Extension Stretch on Towel Roll  - 1 x daily - 7 x weekly - 3 sets - 5 reps - 5 sec  hold   PATIENT EDUCATION:  Education details: HEP, symptom management, edema management, progression of activities Person educated: Patient Education method: Explanation, Demonstration, Tactile cues, Verbal cues, and Handouts Education comprehension: verbalized understanding, returned demonstration, verbal cues required, tactile cues required, and needs further education  HOME EXERCISE PROGRAM: Access Code: [email or web address] URL: [email or web address] Date: 10/06/2023 Prepared by: Lorayne Bender  ASSESSMENT:  CLINICAL IMPRESSION: Began session on bike for dynamic warm up and conditioning. Progressing along well into eccentric quad strengthening. Great mechanics with exercises performed. Patient will continue to benefit from physical therapy in order to improve function and reduce impairment.   Eval hold patient is a 37 year old male status post right unicompartmental knee replacement on 10/03/2023.  At this time he presents with expected limitations in knee range of motion, strength, ability to ambulate, and general functional mobility.  He has significant swelling at this time.  His range of motion is limited in flexion.  He would benefit from skilled therapy to return to active lifestyle.  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 OBJECTIVE IMPAIRMENTS: Abnormal gait, decreased activity tolerance, decreased endurance, decreased mobility, difficulty walking, decreased ROM, decreased strength, increased edema, and pain.   ACTIVITY LIMITATIONS: carrying, lifting, bending, standing, squatting, transfers, bed mobility, toileting, dressing, self feeding, and locomotion level  PARTICIPATION  LIMITATIONS: meal prep, cleaning, laundry, driving, shopping, occupation, and yard work  PERSONAL FACTORS: None   REHAB POTENTIAL: Excellent  CLINICAL DECISION MAKING: Stable/uncomplicated  EVALUATION COMPLEXITY: Low   GOALS: Goals reviewed with patient? Yes  SHORT TERM GOALS: Target date: 11/03/2023   Patient will increase right knee strength by 5 lbs  Baseline: Goal status: goal adjusted not tested 12/6  2.  Patient will demonstrate full knee extension Baseline:  Goal status: improving -2 12/6   3.  Patient will ambulate 500 feet without crutches without significant antalgic gait Baseline:  Goal status: working with cane   4.  Patient will be independent with base exercise program Baseline:  Goal status: Has base exercise program 12/20  5.  Patient will demonstrate a 3 cm reduction in knee edema Goal status: Initial  LONG TERM GOALS: Target date: 12/01/2023    Patient will go up and down 8 steps with reciprocal gait pattern Baseline:  Goal status: INITIAL  2.  Patient will ambulate community distances without pain Baseline:  Goal status: INITIAL  3.  Patient will return to jogging when cleared by MD Baseline:  Goal status: INITIAL  4.  Patient will be independent with complete exercise program Baseline:  Goal status: INITIAL  PLAN:  PT FREQUENCY: 2x/week  PT DURATION: 8 weeks  PLANNED INTERVENTIONS:  97110-Therapeutic exercises, 97530- Therapeutic activity, [id number]- Neuromuscular re-education, 97535- Self Care, [id number]- Manual therapy, [id number]- Gait training, [phone]- Aquatic Therapy, 97014- Electrical stimulation (unattended), [phone]- Ultrasound, Patient/Family education, Stair training, Taping, Dry Needling, DME instructions, Cryotherapy, and Moist heat   PLAN FOR NEXT SESSION: Consider vasopneumatic device next visit if he has continued swelling.  Continue passive range of motion.  Work on straight leg raises if able.  Consider weightbearing exercises.   Consider gait training.    Reola Mosher Raeshawn Vo, PT 11/09/2023, 11:01 AM

## 2023-11-10 ENCOUNTER — Encounter (HOSPITAL_BASED_OUTPATIENT_CLINIC_OR_DEPARTMENT_OTHER): Payer: BC Managed Care – PPO | Admitting: Orthopaedic Surgery

## 2023-11-10 ENCOUNTER — Encounter (HOSPITAL_BASED_OUTPATIENT_CLINIC_OR_DEPARTMENT_OTHER): Payer: BC Managed Care – PPO | Admitting: Physical Therapy

## 2023-11-11 ENCOUNTER — Encounter (HOSPITAL_BASED_OUTPATIENT_CLINIC_OR_DEPARTMENT_OTHER): Payer: Self-pay | Admitting: Physical Therapy

## 2023-11-11 ENCOUNTER — Ambulatory Visit (HOSPITAL_BASED_OUTPATIENT_CLINIC_OR_DEPARTMENT_OTHER): Payer: BC Managed Care – PPO | Admitting: Physical Therapy

## 2023-11-11 DIAGNOSIS — M25561 Pain in right knee: Secondary | ICD-10-CM

## 2023-11-11 DIAGNOSIS — R2689 Other abnormalities of gait and mobility: Secondary | ICD-10-CM

## 2023-11-11 DIAGNOSIS — M25661 Stiffness of right knee, not elsewhere classified: Secondary | ICD-10-CM

## 2023-11-11 DIAGNOSIS — R600 Localized edema: Secondary | ICD-10-CM

## 2023-11-11 NOTE — Therapy (Signed)
OUTPATIENT PHYSICAL THERAPY LOWER EXTREMITY TREATMENT   Patient Name: Jorge Lee MRN: [id number] DOB:10/21/1986, 37 y.o., male Today's Date: 11/11/2023  Progress Note   Reporting Period 10/06/23 to 11/11/23   See note below for Objective Data and Assessment of Progress/Goals   END OF SESSION:  PT End of Session - 11/11/23 1006     Visit Number 10    Number of Visits 24    Date for PT Re-Evaluation 12/23/23    PT Start Time 1004    PT Stop Time 1044    PT Time Calculation (min) 40 min    Activity Tolerance Patient tolerated treatment well    Behavior During Therapy WFL for tasks assessed/performed              Past Medical History:  Diagnosis Date   Narcolepsy    PONV (postoperative nausea and vomiting)    Past Surgical History:  Procedure Laterality Date   KNEE ARTHROSCOPY     x2   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   Procedure: RIGHT  KNEE UNI ARTHROPLASTY;  Surgeon: Huel Cote, MD;  Location: [facility];  Service: Orthopedics;  Laterality: Right;   Patient Active Problem List   Diagnosis Date Noted   Unilateral primary osteoarthritis, right knee 10/03/2023   Baker's cyst of knee, right 09/09/2020   Osteochondritis dissecans of right knee 01/15/2020   Right foot drop 08/02/2019   Posterior right knee pain 12/05/2017    PCP: Leodis Sias MD  REFERRING PROVIDER: Dr Huel Cote   REFERRING DIAG: right UKA 10/03/2023  THERAPY DIAG:  Acute pain of right knee  Stiffness of right knee, not elsewhere classified  Localized edema  Other abnormalities of gait and mobility  Rationale for Evaluation and Treatment: [facility]   ONSET DATE: 10/03/2023  SUBJECTIVE:  Days since surgery: 39  SUBJECTIVE STATEMENT: Patient states some glute soreness after last session. Doing HEP.   Eval: Patient is a 38 year old male status post right unicompartmental knee replacement.  He has a long history of right knee pain.  He is an avid  runner.  His pain and been increasing to the point where he could not run.  At this time he is having significant pain and swelling particularly at night.  He was using crutches for primary ambulation.  PERTINENT HISTORY: No significant PMH PAIN:  Are you having pain? Yes: NPRS scale: 0/10 right now Pain location: right knee  Pain description: aching  Aggravating factors: night time  Relieving factors: pain meds   PRECAUTIONS: None  RED FLAGS: None   WEIGHT BEARING RESTRICTIONS: Yes WBAT  FALLS:  Has patient fallen in last 6 months? No  LIVING ENVIRONMENT: 10 steps inside   OCCUPATION:  Elementary school physical education teacher  Running:   PLOF: Independent  PATIENT GOALS: To have less pain and to return to running/return to work  NEXT MD VISIT:  Next week  OBJECTIVE:  Note: Objective measures were completed at Evaluation unless otherwise noted.  DIAGNOSTIC FINDINGS: Nothing postop  PATIENT SURVEYS:  FOTO 18% currently 62% expected in 20 visits  11/11/23: 59% function  COGNITION: Overall cognitive status: Within functional limits for tasks assessed     SENSATION: [facility]  EDEMA:  Circumferential: Right 49.4 left 41 cm 11/11/23: 42 cm    POSTURE: No Significant postural limitations  PALPATION:   LOWER EXTREMITY ROM:  Passive ROM Right eval Left eval  Right  12/4 Right  12/13 Right 11/09/23 Right 11/11/23  Hip flexion  Hip extension         Hip abduction         Hip adduction         Hip internal rotation         Hip external rotation         Knee flexion 38  85 100 115 121 125  Knee extension -6  -2    0  Ankle dorsiflexion         Ankle plantarflexion         Ankle inversion         Ankle eversion          (Blank rows = not tested)  LOWER EXTREMITY MMT:  MMT Right eval Left eval Right 11/11/23 Left 11/11/23  Hip flexion   62.8 70.4  Hip extension      Hip abduction      Hip adduction      Hip internal rotation       Hip external rotation      Knee flexion      Knee extension   50.5 104  Ankle dorsiflexion      Ankle plantarflexion      Ankle inversion      Ankle eversion       (Blank rows = not tested) not taken today secondary to significant swelling   GAIT: Reviewed how to weight-bear with 2 crutches.  Decreased weightbearing on right side compared to left.  Decreased right hip flexion.  We also reviewed how to use single crutch.  Patient advised not to progress to single crutch until he feels comfortable confident with 2 crutches. TODAY'S TREATMENT:                                                                                                                              DATE:  11/11/23 Bike level 10, 5 minutes for dynamic warm up Reassessment Lateral step down 4 inch 1 x 10, 6 inch 1 x 10  Mini squat slide out 2 x 5 Single leg hip hinge 2 x 10   11/09/23 Bike level 10, 5 minutes for dynamic warm up Step up 6 inch 2 x 10  Lateral step down 4 inch 3 x 10  Lunge 2 x 10 SLS on airex 3 x 30 second holds Manual: retrograde edema massage, scar mobilizations STS with RLE bias 1 x 10  12/20 Manual passive range of motion into flexion and extension  SLR 3x10,  4 lbs 3x10            LAQ 5 lbs 3x15    TKE with cable 10 lbs 3x15  Cable walk x10 20 lbs   Reviewed and updated HEP   12/17 SLR 3x10, 2.5# SAQ 3x10, 4 #  LAQ 3x10 4 lbs   Step ups 6 inch 2x10  Exercise bike 5 min   Leg press 90 lbs 3x15 using RPE  PATIENT EDUCATION:  Education details: HEP, symptom management, edema management, progression of activities Person educated: Patient Education method: Explanation, Demonstration, Tactile cues, Verbal cues, and Handouts Education comprehension: verbalized understanding, returned demonstration, verbal cues required, tactile cues required, and needs further education  HOME EXERCISE PROGRAM: Access Code: [email or web address] URL: [email or web address] Date:  10/06/2023 Prepared by: Lorayne Bender  ASSESSMENT:  CLINICAL IMPRESSION: Began session on bike for dynamic warm up and conditioning. Patient has met 5/5 short term goals and 0/4 long term goals with ability to complete HEP and improvement in symptoms, strength, ROM, activity tolerance, gait, balance, and functional mobility. Remaining goals not met due to continued deficits in strength, functional mobility, activity tolerance. Patient has made good progress toward remaining goals. Extending POC 1-2x/week for 8 weeks. Patient will continue to benefit from skilled physical therapy in order to improve function and reduce impairment.     Eval hold patient is a 37 year old male status post right unicompartmental knee replacement on 10/03/2023.  At this time he presents with expected limitations in knee range of motion, strength, ability to ambulate, and general functional mobility.  He has significant swelling at this time.  His range of motion is limited in flexion.  He would benefit from skilled therapy to return to active lifestyle.  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 OBJECTIVE IMPAIRMENTS: Abnormal gait, decreased activity tolerance, decreased endurance, decreased mobility, difficulty walking, decreased ROM, decreased strength, increased edema, and pain.   ACTIVITY LIMITATIONS: carrying, lifting, bending, standing, squatting, transfers, bed mobility, toileting, dressing, self feeding, and locomotion level  PARTICIPATION LIMITATIONS: meal prep, cleaning, laundry, driving, shopping, occupation, and yard work  PERSONAL FACTORS: None   REHAB POTENTIAL: Excellent  CLINICAL DECISION MAKING: Stable/uncomplicated  EVALUATION COMPLEXITY: Low   GOALS: Goals reviewed with patient? Yes  SHORT TERM GOALS: Target date: 11/03/2023   Patient will increase right knee strength by 5 lbs  Baseline: Goal status: goal adjusted not tested 12/6 11/11/23: likely improved since  eval - MET  2.  Patient will demonstrate full knee extension Baseline:  Goal status: improving -2 12/6 11/11/23: 0 to 125 MET   3.  Patient will ambulate 500 feet without crutches without significant antalgic gait Baseline:  Goal status: working with cane; 11/11/23: MET  4.  Patient will be independent with base exercise program Baseline:  Goal status: Has base exercise program 12/20; MET  5.  Patient will demonstrate a 3 cm reduction in knee edema Goal status: MET  LONG TERM GOALS: Target date: 12/01/2023    Patient will go up and down 8 steps with reciprocal gait pattern Baseline:  Goal status: INITIAL  2.  Patient will ambulate community distances without pain Baseline:  Goal status: INITIAL  3.  Patient will return to jogging when cleared by MD Baseline:  Goal status: INITIAL  4.  Patient will be independent with complete exercise program Baseline:  Goal status: INITIAL  PLAN:  PT FREQUENCY: 1-2x/week  PT DURATION: 8 weeks  PLANNED INTERVENTIONS: 97110-Therapeutic exercises, 97530- Therapeutic activity, [id number]- Neuromuscular re-education, 97535- Self Care, 01027- Manual therapy, [id number]- Gait training, [phone]- Aquatic Therapy, 97014- Electrical stimulation (unattended), 97035- Ultrasound, Patient/Family education, Stair training, Taping, Dry Needling, DME instructions, Cryotherapy, and Moist heat   PLAN FOR NEXT SESSION: Consider vasopneumatic device next visit if he has continued swelling. Continue quad strengthening    Wyman Songster, PT 11/11/2023, 10:25 AM

## 2023-11-14 ENCOUNTER — Ambulatory Visit (HOSPITAL_BASED_OUTPATIENT_CLINIC_OR_DEPARTMENT_OTHER): Payer: BC Managed Care – PPO | Admitting: Physical Therapy

## 2023-11-14 ENCOUNTER — Encounter (HOSPITAL_BASED_OUTPATIENT_CLINIC_OR_DEPARTMENT_OTHER): Payer: Self-pay | Admitting: Physical Therapy

## 2023-11-14 DIAGNOSIS — M25561 Pain in right knee: Secondary | ICD-10-CM

## 2023-11-14 DIAGNOSIS — M25661 Stiffness of right knee, not elsewhere classified: Secondary | ICD-10-CM

## 2023-11-14 DIAGNOSIS — R600 Localized edema: Secondary | ICD-10-CM

## 2023-11-14 DIAGNOSIS — R2689 Other abnormalities of gait and mobility: Secondary | ICD-10-CM

## 2023-11-14 NOTE — Therapy (Signed)
 OUTPATIENT PHYSICAL THERAPY LOWER EXTREMITY TREATMENT   Patient Name: Jorge Lee MRN: [id number] DOB:01-13-1986, 37 y.o., male Today's Date: 11/14/2023  Progress Note   Reporting Period 10/06/23 to 11/11/23   See note below for Objective Data and Assessment of Progress/Goals   END OF SESSION:  PT End of Session - 11/14/23 1025     Visit Number 11    Number of Visits 24    Date for PT Re-Evaluation 12/23/23    PT Start Time 1015    PT Stop Time 1100    PT Time Calculation (min) 45 min    Activity Tolerance Patient tolerated treatment well    Behavior During Therapy WFL for tasks assessed/performed              Past Medical History:  Diagnosis Date   Narcolepsy    PONV (postoperative nausea and vomiting)    Past Surgical History:  Procedure Laterality Date   KNEE ARTHROSCOPY     x2   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   Procedure: RIGHT  KNEE UNI ARTHROPLASTY;  Surgeon: Genelle Standing, MD;  Location: [facility];  Service: Orthopedics;  Laterality: Right;   Patient Active Problem List   Diagnosis Date Noted   Unilateral primary osteoarthritis, right knee 10/03/2023   Baker's cyst of knee, right 09/09/2020   Osteochondritis dissecans of right knee 01/15/2020   Right foot drop 08/02/2019   Posterior right knee pain 12/05/2017    PCP: Gwenn Shams MD  REFERRING PROVIDER: Dr Standing Genelle   REFERRING DIAG: right UKA 10/03/2023  THERAPY DIAG:  Acute pain of right knee  Stiffness of right knee, not elsewhere classified  Localized edema  Other abnormalities of gait and mobility  Rationale for Evaluation and Treatment: [facility]   ONSET DATE: 10/03/2023  SUBJECTIVE:  Days since surgery: 42  SUBJECTIVE STATEMENT: Patient reports only minor soreness after last visit .   Eval: Patient is a 37 year old male status post right unicompartmental knee replacement.  He has a long history of right knee pain.  He is an avid runner.   His pain and been increasing to the point where he could not run.  At this time he is having significant pain and swelling particularly at night.  He was using crutches for primary ambulation.  PERTINENT HISTORY: No significant PMH PAIN:  Are you having pain? Yes: NPRS scale: 0/10 right now Pain location: right knee  Pain description: aching  Aggravating factors: night time  Relieving factors: pain meds   PRECAUTIONS: None  RED FLAGS: None   WEIGHT BEARING RESTRICTIONS: Yes WBAT  FALLS:  Has patient fallen in last 6 months? No  LIVING ENVIRONMENT: 10 steps inside   OCCUPATION:  Elementary school physical education teacher  Running:   PLOF: Independent  PATIENT GOALS: To have less pain and to return to running/return to work  NEXT MD VISIT:  Next week  OBJECTIVE:  Note: Objective measures were completed at Evaluation unless otherwise noted.  DIAGNOSTIC FINDINGS: Nothing postop  PATIENT SURVEYS:  FOTO 18% currently 62% expected in 20 visits  11/11/23: 59% function  COGNITION: Overall cognitive status: Within functional limits for tasks assessed     SENSATION: [facility]  EDEMA:  Circumferential: Right 49.4 left 41 cm 11/11/23: 42 cm    POSTURE: No Significant postural limitations  PALPATION:   LOWER EXTREMITY ROM:  Passive ROM Right eval Left eval  Right  12/4 Right  12/13 Right 11/09/23 Right 11/11/23  Hip flexion  Hip extension         Hip abduction         Hip adduction         Hip internal rotation         Hip external rotation         Knee flexion 38  85 100 115 121 125  Knee extension -6  -2    0  Ankle dorsiflexion         Ankle plantarflexion         Ankle inversion         Ankle eversion          (Blank rows = not tested)  LOWER EXTREMITY MMT:  MMT Right eval Left eval Right 11/11/23 Left 11/11/23  Hip flexion   62.8 70.4  Hip extension      Hip abduction      Hip adduction      Hip internal rotation      Hip  external rotation      Knee flexion      Knee extension   50.5 104  Ankle dorsiflexion      Ankle plantarflexion      Ankle inversion      Ankle eversion       (Blank rows = not tested) not taken today secondary to significant swelling   GAIT: Reviewed how to weight-bear with 2 crutches.  Decreased weightbearing on right side compared to left.  Decreased right hip flexion.  We also reviewed how to use single crutch.  Patient advised not to progress to single crutch until he feels comfortable confident with 2 crutches. TODAY'S TREATMENT:                                                                                                                              DATE:   Manual: assessed HEP  LAQ 5 lb 3x12  SLR 3x12 4 lbs   Leg press 3x12 90 lbs  Cable Walk 20lbs  TKE 3x12  25 lbs 1st set 35 lbs next 2      11/11/23 Bike level 10, 5 minutes for dynamic warm up Reassessment Lateral step down 4 inch 1 x 10, 6 inch 1 x 10  Mini squat slide out 2 x 5 Single leg hip hinge 2 x 10   11/09/23 Bike level 10, 5 minutes for dynamic warm up Step up 6 inch 2 x 10  Lateral step down 4 inch 3 x 10  Lunge 2 x 10 SLS on airex 3 x 30 second holds Manual: retrograde edema massage, scar mobilizations STS with RLE bias 1 x 10  12/20 Manual passive range of motion into flexion and extension  SLR 3x10,  4 lbs 3x10            LAQ 5 lbs 3x15    TKE with cable 10 lbs 3x15  Cable walk x10 20 lbs   Reviewed and updated  HEP   12/17 SLR 3x10, 2.5# SAQ 3x10, 4 #  LAQ 3x10 4 lbs   Step ups 6 inch 2x10  Exercise bike 5 min   Leg press 90 lbs 3x15 using RPE       PATIENT EDUCATION:  Education details: HEP, symptom management, edema management, progression of activities Person educated: Patient Education method: Explanation, Demonstration, Tactile cues, Verbal cues, and Handouts Education comprehension: verbalized understanding, returned demonstration, verbal cues required, tactile  cues required, and needs further education  HOME EXERCISE PROGRAM: Access Code: [email or web address] URL: [email or web address] Date: 10/06/2023 Prepared by: Alm Don  ASSESSMENT:  CLINICAL IMPRESSION: The patient reported a tight feleing coming in but had no restriction on ROM. We continue to move his weights forward and exercises. He had no signiifcant pain following treatment. We continue to focus on eccentrics and step downs.    Eval hold patient is a 37 year old male status post right unicompartmental knee replacement on 10/03/2023.  At this time he presents with expected limitations in knee range of motion, strength, ability to ambulate, and general functional mobility.  He has significant swelling at this time.  His range of motion is limited in flexion.  He would benefit from skilled therapy to return to active lifestyle.  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 OBJECTIVE IMPAIRMENTS: Abnormal gait, decreased activity tolerance, decreased endurance, decreased mobility, difficulty walking, decreased ROM, decreased strength, increased edema, and pain.   ACTIVITY LIMITATIONS: carrying, lifting, bending, standing, squatting, transfers, bed mobility, toileting, dressing, self feeding, and locomotion level  PARTICIPATION LIMITATIONS: meal prep, cleaning, laundry, driving, shopping, occupation, and yard work  PERSONAL FACTORS: None   REHAB POTENTIAL: Excellent  CLINICAL DECISION MAKING: Stable/uncomplicated  EVALUATION COMPLEXITY: Low   GOALS: Goals reviewed with patient? Yes  SHORT TERM GOALS: Target date: 11/03/2023   Patient will increase right knee strength by 5 lbs  Baseline: Goal status: goal adjusted not tested 12/6 11/11/23: likely improved since eval - MET  2.  Patient will demonstrate full knee extension Baseline:  Goal status: improving -2 12/6 11/11/23: 0 to 125 MET   3.  Patient will ambulate 500 feet without crutches  without significant antalgic gait Baseline:  Goal status: working with cane; 11/11/23: MET  4.  Patient will be independent with base exercise program Baseline:  Goal status: Has base exercise program 12/20; MET  5.  Patient will demonstrate a 3 cm reduction in knee edema Goal status: MET  LONG TERM GOALS: Target date: 12/01/2023    Patient will go up and down 8 steps with reciprocal gait pattern Baseline:  Goal status: INITIAL  2.  Patient will ambulate community distances without pain Baseline:  Goal status: INITIAL  3.  Patient will return to jogging when cleared by MD Baseline:  Goal status: INITIAL  4.  Patient will be independent with complete exercise program Baseline:  Goal status: INITIAL  PLAN:  PT FREQUENCY: 1-2x/week  PT DURATION: 8 weeks  PLANNED INTERVENTIONS: 97110-Therapeutic exercises, 97530- Therapeutic activity, [id number]- Neuromuscular re-education, 97535- Self Care, [id number]- Manual therapy, [id number]- Gait training, [phone]- Aquatic Therapy, 97014- Electrical stimulation (unattended), 97035- Ultrasound, Patient/Family education, Stair training, Taping, Dry Needling, DME instructions, Cryotherapy, and Moist heat   PLAN FOR NEXT SESSION: Consider vasopneumatic device next visit if he has continued swelling. Continue quad strengthening    Alm JINNY Don, PT 11/14/2023, 1:53 PM

## 2023-11-17 ENCOUNTER — Encounter (HOSPITAL_BASED_OUTPATIENT_CLINIC_OR_DEPARTMENT_OTHER): Payer: BC Managed Care – PPO | Admitting: Physical Therapy

## 2023-11-17 ENCOUNTER — Ambulatory Visit (HOSPITAL_BASED_OUTPATIENT_CLINIC_OR_DEPARTMENT_OTHER): Payer: 59 | Admitting: Orthopaedic Surgery

## 2023-11-17 ENCOUNTER — Ambulatory Visit (HOSPITAL_BASED_OUTPATIENT_CLINIC_OR_DEPARTMENT_OTHER): Payer: 59 | Attending: Orthopaedic Surgery | Admitting: Physical Therapy

## 2023-11-17 ENCOUNTER — Encounter (HOSPITAL_BASED_OUTPATIENT_CLINIC_OR_DEPARTMENT_OTHER): Payer: Self-pay | Admitting: Physical Therapy

## 2023-11-17 DIAGNOSIS — R2689 Other abnormalities of gait and mobility: Secondary | ICD-10-CM | POA: Insufficient documentation

## 2023-11-17 DIAGNOSIS — M25661 Stiffness of right knee, not elsewhere classified: Secondary | ICD-10-CM | POA: Insufficient documentation

## 2023-11-17 DIAGNOSIS — M25561 Pain in right knee: Secondary | ICD-10-CM | POA: Diagnosis present

## 2023-11-17 DIAGNOSIS — M1711 Unilateral primary osteoarthritis, right knee: Secondary | ICD-10-CM

## 2023-11-17 DIAGNOSIS — R600 Localized edema: Secondary | ICD-10-CM | POA: Insufficient documentation

## 2023-11-17 NOTE — Therapy (Signed)
 OUTPATIENT PHYSICAL THERAPY LOWER EXTREMITY TREATMENT   Patient Name: Jorge Lee MRN: [id number] DOB:July 08, 1986, 38 y.o., male Today's Date: 11/17/2023  Progress Note   Reporting Period 10/06/23 to 11/11/23   See note below for Objective Data and Assessment of Progress/Goals   END OF SESSION:  PT End of Session - 11/17/23 1127     Visit Number 12    Number of Visits 24    Date for PT Re-Evaluation 12/23/23    PT Start Time 1018    PT Stop Time 1100    PT Time Calculation (min) 42 min    Activity Tolerance Patient tolerated treatment well    Behavior During Therapy WFL for tasks assessed/performed               Past Medical History:  Diagnosis Date   Narcolepsy    PONV (postoperative nausea and vomiting)    Past Surgical History:  Procedure Laterality Date   KNEE ARTHROSCOPY     x2   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   Procedure: RIGHT  KNEE UNI ARTHROPLASTY;  Surgeon: Genelle Standing, MD;  Location: [facility];  Service: Orthopedics;  Laterality: Right;   Patient Active Problem List   Diagnosis Date Noted   Unilateral primary osteoarthritis, right knee 10/03/2023   Baker's cyst of knee, right 09/09/2020   Osteochondritis dissecans of right knee 01/15/2020   Right foot drop 08/02/2019   Posterior right knee pain 12/05/2017    PCP: Gwenn Shams MD  REFERRING PROVIDER: Dr Standing Genelle   REFERRING DIAG: right UKA 10/03/2023  THERAPY DIAG:  Acute pain of right knee  Stiffness of right knee, not elsewhere classified  Localized edema  Other abnormalities of gait and mobility  Rationale for Evaluation and Treatment: [facility]   ONSET DATE: 10/03/2023  SUBJECTIVE:  Days since surgery: 45  SUBJECTIVE STATEMENT: Patient has no complaints today. He reports he is doing well.   Eval: Patient is a 38 year old male status post right unicompartmental knee replacement.  He has a long history of right knee pain.  He is an avid  runner.  His pain and been increasing to the point where he could not run.  At this time he is having significant pain and swelling particularly at night.  He was using crutches for primary ambulation.  PERTINENT HISTORY: No significant PMH PAIN:  Are you having pain? Yes: NPRS scale: 0/10 right now Pain location: right knee  Pain description: aching  Aggravating factors: night time  Relieving factors: pain meds   PRECAUTIONS: None  RED FLAGS: None   WEIGHT BEARING RESTRICTIONS: Yes WBAT  FALLS:  Has patient fallen in last 6 months? No  LIVING ENVIRONMENT: 10 steps inside   OCCUPATION:  Elementary school physical education teacher  Running:   PLOF: Independent  PATIENT GOALS: To have less pain and to return to running/return to work  NEXT MD VISIT:  Next week  OBJECTIVE:  Note: Objective measures were completed at Evaluation unless otherwise noted.  DIAGNOSTIC FINDINGS: Nothing postop  PATIENT SURVEYS:  FOTO 18% currently 62% expected in 20 visits  11/11/23: 59% function  COGNITION: Overall cognitive status: Within functional limits for tasks assessed     SENSATION: [facility]  EDEMA:  Circumferential: Right 49.4 left 41 cm 11/11/23: 42 cm    POSTURE: No Significant postural limitations  PALPATION:   LOWER EXTREMITY ROM:  Passive ROM Right eval Left eval  Right  12/4 Right  12/13 Right 11/09/23 Right 11/11/23  Hip flexion  Hip extension         Hip abduction         Hip adduction         Hip internal rotation         Hip external rotation         Knee flexion 38  85 100 115 121 125  Knee extension -6  -2    0  Ankle dorsiflexion         Ankle plantarflexion         Ankle inversion         Ankle eversion          (Blank rows = not tested)  LOWER EXTREMITY MMT:  MMT Right eval Left eval Right 11/11/23 Left 11/11/23  Hip flexion   62.8 70.4  Hip extension      Hip abduction      Hip adduction      Hip internal rotation       Hip external rotation      Knee flexion      Knee extension   50.5 104  Ankle dorsiflexion      Ankle plantarflexion      Ankle inversion      Ankle eversion       (Blank rows = not tested) not taken today secondary to significant swelling   GAIT: Reviewed how to weight-bear with 2 crutches.  Decreased weightbearing on right side compared to left.  Decreased right hip flexion.  We also reviewed how to use single crutch.  Patient advised not to progress to single crutch until he feels comfortable confident with 2 crutches. TODAY'S TREATMENT:                                                                                                                              DATE:   1/3  LF leg press 2x20 55 lbs  LF knee extension 10 lbs 3x10   Goblet squat 3x12 15 lbs TRX squat 3x12   Air-ex step on x20  Lateral step onto airr-ex  x20     Last visit:  Manual: assessed HEP  LAQ 5 lb 3x12  SLR 3x12 4 lbs   Leg press 3x12 90 lbs  Cable Walk 20lbs  TKE 3x12  25 lbs 1st set 35 lbs next 2      11/11/23 Bike level 10, 5 minutes for dynamic warm up Reassessment Lateral step down 4 inch 1 x 10, 6 inch 1 x 10  Mini squat slide out 2 x 5 Single leg hip hinge 2 x 10   11/09/23 Bike level 10, 5 minutes for dynamic warm up Step up 6 inch 2 x 10  Lateral step down 4 inch 3 x 10  Lunge 2 x 10 SLS on airex 3 x 30 second holds Manual: retrograde edema massage, scar mobilizations STS with RLE bias 1 x 10  12/20 Manual passive range of motion into  flexion and extension  SLR 3x10,  4 lbs 3x10            LAQ 5 lbs 3x15    TKE with cable 10 lbs 3x15  Cable walk x10 20 lbs   Reviewed and updated HEP   12/17 SLR 3x10, 2.5# SAQ 3x10, 4 #  LAQ 3x10 4 lbs   Step ups 6 inch 2x10  Exercise bike 5 min   Leg press 90 lbs 3x15 using RPE       PATIENT EDUCATION:  Education details: HEP, symptom management, edema management, progression of activities Person educated:  Patient Education method: Explanation, Demonstration, Tactile cues, Verbal cues, and Handouts Education comprehension: verbalized understanding, returned demonstration, verbal cues required, tactile cues required, and needs further education  HOME EXERCISE PROGRAM: Access Code: [email or web address] URL: [email or web address] Date: 10/06/2023 Prepared by: Alm Don  ASSESSMENT:  CLINICAL IMPRESSION: The patient is making good progress. We reviewed more options to do at the gym. He has just come from the MD who is happy with his progress. He had no significant pain with treatment. Therapy will continue to progress as tolerated.   Eval hold patient is a 38 year old male status post right unicompartmental knee replacement on 10/03/2023.  At this time he presents with expected limitations in knee range of motion, strength, ability to ambulate, and general functional mobility.  He has significant swelling at this time.  His range of motion is limited in flexion.  He would benefit from skilled therapy to return to active lifestyle.  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 OBJECTIVE IMPAIRMENTS: Abnormal gait, decreased activity tolerance, decreased endurance, decreased mobility, difficulty walking, decreased ROM, decreased strength, increased edema, and pain.   ACTIVITY LIMITATIONS: carrying, lifting, bending, standing, squatting, transfers, bed mobility, toileting, dressing, self feeding, and locomotion level  PARTICIPATION LIMITATIONS: meal prep, cleaning, laundry, driving, shopping, occupation, and yard work  PERSONAL FACTORS: None   REHAB POTENTIAL: Excellent  CLINICAL DECISION MAKING: Stable/uncomplicated  EVALUATION COMPLEXITY: Low   GOALS: Goals reviewed with patient? Yes  SHORT TERM GOALS: Target date: 11/03/2023   Patient will increase right knee strength by 5 lbs  Baseline: Goal status: goal adjusted not tested 12/6 11/11/23: likely improved  since eval - MET  2.  Patient will demonstrate full knee extension Baseline:  Goal status: improving -2 12/6 11/11/23: 0 to 125 MET   3.  Patient will ambulate 500 feet without crutches without significant antalgic gait Baseline:  Goal status: working with cane; 11/11/23: MET  4.  Patient will be independent with base exercise program Baseline:  Goal status: Has base exercise program 12/20; MET  5.  Patient will demonstrate a 3 cm reduction in knee edema Goal status: MET  LONG TERM GOALS: Target date: 12/01/2023    Patient will go up and down 8 steps with reciprocal gait pattern Baseline:  Goal status: progressing stairs as tolerated 1/3  2.  Patient will ambulate community distances without pain Baseline:  Goal status: distance imprpving but not met 1/3 3.  Patient will return to jogging when cleared by MD Baseline:  Goal status: INITIAL  4.  Patient will be independent with complete exercise program Baseline:  Goal status: INITIAL  PLAN:  PT FREQUENCY: 1-2x/week  PT DURATION: 8 weeks  PLANNED INTERVENTIONS: 97110-Therapeutic exercises, 97530- Therapeutic activity, [id number]- Neuromuscular re-education, 97535- Self Care, [id number]- Manual therapy, [id number]- Gait training, [phone]- Aquatic Therapy, 97014- Electrical stimulation (unattended), [phone]- Ultrasound, Patient/Family education, Stair training, Taping, Dry Needling,  DME instructions, Cryotherapy, and Moist heat   PLAN FOR NEXT SESSION: Consider vasopneumatic device next visit if he has continued swelling. Continue quad strengthening    Alm JINNY Don, PT 11/17/2023, 11:29 AM

## 2023-11-17 NOTE — Progress Notes (Signed)
 Post Operative Evaluation    Procedure/Date of Surgery: Right knee unicompartmental knee arthroplasty 11/19  Interval History:   Presents today 6 weeks status post right knee partial knee replacement overall doing very well.  Overall he is continuing to improve.  He is now walking without any type of assistive devices.  PMH/PSH/Family History/Social History/Meds/Allergies:    Past Medical History:  Diagnosis Date   Narcolepsy    PONV (postoperative nausea and vomiting)    Past Surgical History:  Procedure Laterality Date   KNEE ARTHROSCOPY     x2   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   Procedure: RIGHT  KNEE UNI ARTHROPLASTY;  Surgeon: Genelle Standing, MD;  Location: [facility];  Service: Orthopedics;  Laterality: Right;   Social History   Socioeconomic History   Marital status: Married    Spouse name: Not on file   Number of children: Not on file   Years of education: Not on file   Highest education level: Not on file  Occupational History   Not on file  Tobacco Use   Smoking status: Never   Smokeless tobacco: Never  Vaping Use   Vaping status: Never Used  Substance and Sexual Activity   Alcohol use: Yes    Comment: occas   Drug use: No   Sexual activity: Not on file  Other Topics Concern   Not on file  Social History Narrative   Not on file   Social Drivers of Health   Financial Resource Strain: Not on file  Food Insecurity: Not on file  Transportation Needs: Not on file  Physical Activity: Not on file  Stress: Not on file  Social Connections: Not on file   No family history on file. No Known Allergies Current Outpatient Medications  Medication Sig Dispense Refill   amphetamine-dextroamphetamine (ADDERALL) 20 MG tablet Take 10 mg by mouth 2 (two) times daily.  0   aspirin  EC 325 MG tablet Take 1 tablet (325 mg total) by mouth daily. 30 tablet 0   ibuprofen  (ADVIL ) 800 MG tablet Take 1 tablet (800  mg total) by mouth every 8 (eight) hours as needed. 30 tablet 0   Multiple Vitamins-Minerals (MULTIVITAMIN WITH MINERALS) tablet Take 1 tablet by mouth daily.     oxyCODONE  (ROXICODONE ) 5 MG immediate release tablet Take 1 tablet (5 mg total) by mouth every 4 (four) hours as needed for severe pain (pain score 7-10) or breakthrough pain. 20 tablet 0   No current facility-administered medications for this visit.   No results found.  Review of Systems:   A ROS was performed including pertinent positives and negatives as documented in the HPI.   Musculoskeletal Exam:    There were no vitals taken for this visit.  Right knee incision is well-appearing without erythema or drainage.  Range of motion is 0 to 120 degrees.  This is no pain.  Mild quad atrophy.  Distal neurosensory exam is intact  Imaging:    4 views right knee: Status post unicompartmental medial knee arthroplasty without complication  I personally reviewed and interpreted the radiographs.   Assessment:   6 weeks status post right knee medial unicompartmental knee arthroplasty.  Overall he is doing very well.  At this time he will continue to progress strengthening.  I will plan to see him  back in 6 weeks for reassessment  Plan :    -Return to clinic 6 weeks for reassessment      I personally saw and evaluated the patient, and participated in the management and treatment plan.  Elspeth Parker, MD Attending Physician, Orthopedic Surgery  This document was dictated using Dragon voice recognition software. A reasonable attempt at proof reading has been made to minimize errors.

## 2023-11-20 ENCOUNTER — Ambulatory Visit (HOSPITAL_BASED_OUTPATIENT_CLINIC_OR_DEPARTMENT_OTHER): Payer: 59 | Admitting: Physical Therapy

## 2023-11-21 ENCOUNTER — Ambulatory Visit (HOSPITAL_BASED_OUTPATIENT_CLINIC_OR_DEPARTMENT_OTHER): Payer: 59 | Admitting: Physical Therapy

## 2023-11-21 ENCOUNTER — Encounter (HOSPITAL_BASED_OUTPATIENT_CLINIC_OR_DEPARTMENT_OTHER): Payer: Self-pay | Admitting: Physical Therapy

## 2023-11-21 DIAGNOSIS — M25661 Stiffness of right knee, not elsewhere classified: Secondary | ICD-10-CM

## 2023-11-21 DIAGNOSIS — R600 Localized edema: Secondary | ICD-10-CM

## 2023-11-21 DIAGNOSIS — M25561 Pain in right knee: Secondary | ICD-10-CM | POA: Diagnosis not present

## 2023-11-21 DIAGNOSIS — R2689 Other abnormalities of gait and mobility: Secondary | ICD-10-CM

## 2023-11-21 NOTE — Therapy (Signed)
 OUTPATIENT PHYSICAL THERAPY LOWER EXTREMITY TREATMENT   Patient Name: Jorge Lee MRN: [id number] DOB:1986/04/20, 38 y.o., male Today's Date: 11/21/2023  Progress Note   Reporting Period 10/06/23 to 11/11/23   See note below for Objective Data and Assessment of Progress/Goals   END OF SESSION:  PT End of Session - 11/21/23 0805     Visit Number 13    Number of Visits 24    Date for PT Re-Evaluation 12/23/23    PT Start Time 0801    PT Stop Time 0844    PT Time Calculation (min) 43 min    Activity Tolerance Patient tolerated treatment well    Behavior During Therapy WFL for tasks assessed/performed               Past Medical History:  Diagnosis Date   Narcolepsy    PONV (postoperative nausea and vomiting)    Past Surgical History:  Procedure Laterality Date   KNEE ARTHROSCOPY     x2   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   Procedure: RIGHT  KNEE UNI ARTHROPLASTY;  Surgeon: Genelle Standing, MD;  Location: [facility];  Service: Orthopedics;  Laterality: Right;   Patient Active Problem List   Diagnosis Date Noted   Unilateral primary osteoarthritis, right knee 10/03/2023   Baker's cyst of knee, right 09/09/2020   Osteochondritis dissecans of right knee 01/15/2020   Right foot drop 08/02/2019   Posterior right knee pain 12/05/2017    PCP: Gwenn Shams MD  REFERRING PROVIDER: Dr Standing Genelle   REFERRING DIAG: right UKA 10/03/2023  THERAPY DIAG:  Acute pain of right knee  Stiffness of right knee, not elsewhere classified  Localized edema  Other abnormalities of gait and mobility  Rationale for Evaluation and Treatment: [facility]   ONSET DATE: 10/03/2023  SUBJECTIVE:  Days since surgery: 49  SUBJECTIVE STATEMENT: The patient walked 2 miles on Saturday. His knee swelled. He feels like the knee wont go all the way straight.  Eval: Patient is a 38 year old male status post right unicompartmental knee replacement.  He has a long  history of right knee pain.  He is an avid runner.  His pain and been increasing to the point where he could not run.  At this time he is having significant pain and swelling particularly at night.  He was using crutches for primary ambulation.  PERTINENT HISTORY: No significant PMH PAIN:  Are you having pain? Yes: NPRS scale: 0/10 right now Pain location: right knee  Pain description: aching  Aggravating factors: night time  Relieving factors: pain meds   PRECAUTIONS: None  RED FLAGS: None   WEIGHT BEARING RESTRICTIONS: Yes WBAT  FALLS:  Has patient fallen in last 6 months? No  LIVING ENVIRONMENT: 10 steps inside   OCCUPATION:  Elementary school physical education teacher  Running:   PLOF: Independent  PATIENT GOALS: To have less pain and to return to running/return to work  NEXT MD VISIT:  Next week  OBJECTIVE:  Note: Objective measures were completed at Evaluation unless otherwise noted.  DIAGNOSTIC FINDINGS: Nothing postop  PATIENT SURVEYS:  FOTO 18% currently 62% expected in 20 visits  11/11/23: 59% function  COGNITION: Overall cognitive status: Within functional limits for tasks assessed     SENSATION: [facility]  EDEMA:  Circumferential: Right 49.4 left 41 cm 11/11/23: 42 cm    POSTURE: No Significant postural limitations  PALPATION:   LOWER EXTREMITY ROM:  Passive ROM Right eval Left eval  Right  12/4 Right  12/13 Right 11/09/23 Right 11/11/23  Hip flexion         Hip extension         Hip abduction         Hip adduction         Hip internal rotation         Hip external rotation         Knee flexion 38  85 100 115 121 125  Knee extension -6  -2    0  Ankle dorsiflexion         Ankle plantarflexion         Ankle inversion         Ankle eversion          (Blank rows = not tested)  LOWER EXTREMITY MMT:  MMT Right eval Left eval Right 11/11/23 Left 11/11/23  Hip flexion   62.8 70.4  Hip extension      Hip abduction      Hip  adduction      Hip internal rotation      Hip external rotation      Knee flexion      Knee extension   50.5 104  Ankle dorsiflexion      Ankle plantarflexion      Ankle inversion      Ankle eversion       (Blank rows = not tested) not taken today secondary to significant swelling   GAIT: Reviewed how to weight-bear with 2 crutches.  Decreased weightbearing on right side compared to left.  Decreased right hip flexion.  We also reviewed how to use single crutch.  Patient advised not to progress to single crutch until he feels comfortable confident with 2 crutches. TODAY'S TREATMENT:                                                                                                                              DATE:  1/7 Ex bike 6 min L10  LAQ 3x12 4 lbs  SLR 3x12 4 lbs    Cable Walk 25lbs  x10 each direction  TKE 3x12  25 lbs 1st set 35 lbs next 2   Manual: extension stretching     1/3  LF leg press 2x20 55 lbs  LF knee extension 10 lbs 3x10   Goblet squat 3x12 15 lbs TRX squat 3x12   Air-ex step on x20  Lateral step onto airr-ex  x20     Last visit:  Manual: assessed HEP  LAQ 5 lb 3x12  SLR 3x12 4 lbs   Leg press 3x12 90 lbs  Cable Walk 20lbs  TKE 3x12  25 lbs 1st set 35 lbs next 2      11/11/23 Bike level 10, 5 minutes for dynamic warm up Reassessment Lateral step down 4 inch 1 x 10, 6 inch 1 x 10  Mini squat slide out 2 x 5 Single leg hip hinge 2 x 10  11/09/23 Bike level 10, 5 minutes for dynamic warm up Step up 6 inch 2 x 10  Lateral step down 4 inch 3 x 10  Lunge 2 x 10 SLS on airex 3 x 30 second holds Manual: retrograde edema massage, scar mobilizations STS with RLE bias 1 x 10  12/20 Manual passive range of motion into flexion and extension  SLR 3x10,  4 lbs 3x10            LAQ 5 lbs 3x15    TKE with cable 10 lbs 3x15  Cable walk x10 20 lbs   Reviewed and updated HEP   12/17 SLR 3x10, 2.5# SAQ 3x10, 4 #  LAQ 3x10 4 lbs   Step  ups 6 inch 2x10  Exercise bike 5 min   Leg press 90 lbs 3x15 using RPE       PATIENT EDUCATION:  Education details: HEP, symptom management, edema management, progression of activities Person educated: Patient Education method: Explanation, Demonstration, Tactile cues, Verbal cues, and Handouts Education comprehension: verbalized understanding, returned demonstration, verbal cues required, tactile cues required, and needs further education  HOME EXERCISE PROGRAM: Access Code: [email or web address] URL: [email or web address] Date: 10/06/2023 Prepared by: Alm Don  ASSESSMENT:  CLINICAL IMPRESSION: The patient continues to tolerate treatment well. We worked on extension today Eval hold patient is a 38 year old male status post right unicompartmental knee replacement on 10/03/2023.  At this time he presents with expected limitations in knee range of motion, strength, ability to ambulate, and general functional mobility.  He has significant swelling at this time.  His range of motion is limited in flexion.  He would benefit from skilled therapy to return to active lifestyle.  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 OBJECTIVE IMPAIRMENTS: Abnormal gait, decreased activity tolerance, decreased endurance, decreased mobility, difficulty walking, decreased ROM, decreased strength, increased edema, and pain.   ACTIVITY LIMITATIONS: carrying, lifting, bending, standing, squatting, transfers, bed mobility, toileting, dressing, self feeding, and locomotion level  PARTICIPATION LIMITATIONS: meal prep, cleaning, laundry, driving, shopping, occupation, and yard work  PERSONAL FACTORS: None   REHAB POTENTIAL: Excellent  CLINICAL DECISION MAKING: Stable/uncomplicated  EVALUATION COMPLEXITY: Low   GOALS: Goals reviewed with patient? Yes  SHORT TERM GOALS: Target date: 11/03/2023   Patient will increase right knee strength by 5 lbs  Baseline: Goal status:  goal adjusted not tested 12/6 11/11/23: likely improved since eval - MET  2.  Patient will demonstrate full knee extension Baseline:  Goal status: improving -2 12/6 11/11/23: 0 to 125 MET   3.  Patient will ambulate 500 feet without crutches without significant antalgic gait Baseline:  Goal status: working with cane; 11/11/23: MET  4.  Patient will be independent with base exercise program Baseline:  Goal status: Has base exercise program 12/20; MET  5.  Patient will demonstrate a 3 cm reduction in knee edema Goal status: MET  LONG TERM GOALS: Target date: 12/01/2023    Patient will go up and down 8 steps with reciprocal gait pattern Baseline:  Goal status: progressing stairs as tolerated 1/3  2.  Patient will ambulate community distances without pain Baseline:  Goal status: distance imprpving but not met 1/3 3.  Patient will return to jogging when cleared by MD Baseline:  Goal status: INITIAL  4.  Patient will be independent with complete exercise program Baseline:  Goal status: INITIAL  PLAN:  PT FREQUENCY: 1-2x/week  PT DURATION: 8 weeks  PLANNED INTERVENTIONS: 97110-Therapeutic exercises, 97530- Therapeutic activity,  02887- Neuromuscular re-education, [phone]- Self Care, 02859- Manual therapy, [id number]- Gait training, [id number]- Aquatic Therapy, 97014- Electrical stimulation (unattended), [id number]- Ultrasound, Patient/Family education, Stair training, Taping, Dry Needling, DME instructions, Cryotherapy, and Moist heat   PLAN FOR NEXT SESSION: Consider vasopneumatic device next visit if he has continued swelling. Continue quad strengthening    Alm JINNY Don, PT 11/21/2023, 8:20 AM

## 2023-11-24 ENCOUNTER — Encounter (HOSPITAL_BASED_OUTPATIENT_CLINIC_OR_DEPARTMENT_OTHER): Payer: BC Managed Care – PPO | Admitting: Physical Therapy

## 2023-11-27 ENCOUNTER — Ambulatory Visit (HOSPITAL_BASED_OUTPATIENT_CLINIC_OR_DEPARTMENT_OTHER): Payer: 59 | Admitting: Physical Therapy

## 2023-11-27 ENCOUNTER — Encounter (HOSPITAL_BASED_OUTPATIENT_CLINIC_OR_DEPARTMENT_OTHER): Payer: Self-pay | Admitting: Physical Therapy

## 2023-11-27 DIAGNOSIS — M25661 Stiffness of right knee, not elsewhere classified: Secondary | ICD-10-CM

## 2023-11-27 DIAGNOSIS — R600 Localized edema: Secondary | ICD-10-CM

## 2023-11-27 DIAGNOSIS — M25561 Pain in right knee: Secondary | ICD-10-CM

## 2023-11-27 DIAGNOSIS — R2689 Other abnormalities of gait and mobility: Secondary | ICD-10-CM

## 2023-11-27 NOTE — Therapy (Signed)
 OUTPATIENT PHYSICAL THERAPY LOWER EXTREMITY TREATMENT   Patient Name: Jorge Lee MRN: [id number] DOB:February 13, 1986, 38 y.o., male Today's Date: 11/28/2023  Progress Note   Reporting Period 10/06/23 to 11/11/23   See note below for Objective Data and Assessment of Progress/Goals   END OF SESSION:  PT End of Session - 11/27/23 1326     Visit Number 14    Number of Visits 24    Date for PT Re-Evaluation 12/23/23    PT Start Time 1315    PT Stop Time 1400    PT Time Calculation (min) 45 min    Activity Tolerance Patient tolerated treatment well    Behavior During Therapy WFL for tasks assessed/performed               Past Medical History:  Diagnosis Date   Narcolepsy    PONV (postoperative nausea and vomiting)    Past Surgical History:  Procedure Laterality Date   KNEE ARTHROSCOPY     x2   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   Procedure: RIGHT  KNEE UNI ARTHROPLASTY;  Surgeon: Genelle Standing, MD;  Location: [facility];  Service: Orthopedics;  Laterality: Right;   Patient Active Problem List   Diagnosis Date Noted   Unilateral primary osteoarthritis, right knee 10/03/2023   Baker's cyst of knee, right 09/09/2020   Osteochondritis dissecans of right knee 01/15/2020   Right foot drop 08/02/2019   Posterior right knee pain 12/05/2017    PCP: Gwenn Shams MD  REFERRING PROVIDER: Dr Standing Genelle   REFERRING DIAG: right UKA 10/03/2023  THERAPY DIAG:  Acute pain of right knee  Stiffness of right knee, not elsewhere classified  Localized edema  Other abnormalities of gait and mobility  Rationale for Evaluation and Treatment: [facility]   ONSET DATE: 10/03/2023  SUBJECTIVE:  Days since surgery: 55  SUBJECTIVE STATEMENT: The patient stepped on a small snow back and had a little pain with hyper extension. He otherwis is doing well. He will contact his school about return to work requirements.   Eval: Patient is a 38 year old male  status post right unicompartmental knee replacement.  He has a long history of right knee pain.  He is an avid runner.  His pain and been increasing to the point where he could not run.  At this time he is having significant pain and swelling particularly at night.  He was using crutches for primary ambulation.  PERTINENT HISTORY: No significant PMH PAIN:  Are you having pain? Yes: NPRS scale: 0/10 right now Pain location: right knee  Pain description: aching  Aggravating factors: night time  Relieving factors: pain meds   PRECAUTIONS: None  RED FLAGS: None   WEIGHT BEARING RESTRICTIONS: Yes WBAT  FALLS:  Has patient fallen in last 6 months? No  LIVING ENVIRONMENT: 10 steps inside   OCCUPATION:  Elementary school physical education teacher  Running:   PLOF: Independent  PATIENT GOALS: To have less pain and to return to running/return to work  NEXT MD VISIT:  Next week  OBJECTIVE:  Note: Objective measures were completed at Evaluation unless otherwise noted.  DIAGNOSTIC FINDINGS: Nothing postop  PATIENT SURVEYS:  FOTO 18% currently 62% expected in 20 visits  11/11/23: 59% function  COGNITION: Overall cognitive status: Within functional limits for tasks assessed     SENSATION: WFL  EDEMA:  Circumferential: Right 49.4 left 41 cm 11/11/23: 42 cm    POSTURE: No Significant postural limitations  PALPATION:   LOWER EXTREMITY ROM:  Passive ROM Right eval Left eval  Right  12/4 Right  12/13 Right 11/09/23 Right 11/11/23  Hip flexion         Hip extension         Hip abduction         Hip adduction         Hip internal rotation         Hip external rotation         Knee flexion 38  85 100 115 121 125  Knee extension -6  -2    0  Ankle dorsiflexion         Ankle plantarflexion         Ankle inversion         Ankle eversion          (Blank rows = not tested)  LOWER EXTREMITY MMT:  MMT Right eval Left eval Right 11/11/23 Left 11/11/23  Hip  flexion   62.8 70.4  Hip extension      Hip abduction      Hip adduction      Hip internal rotation      Hip external rotation      Knee flexion      Knee extension   50.5 104  Ankle dorsiflexion      Ankle plantarflexion      Ankle inversion      Ankle eversion       (Blank rows = not tested) not taken today secondary to significant swelling   GAIT: Reviewed how to weight-bear with 2 crutches.  Decreased weightbearing on right side compared to left.  Decreased right hip flexion.  We also reviewed how to use single crutch.  Patient advised not to progress to single crutch until he feels comfortable confident with 2 crutches. TODAY'S TREATMENT:                                                                                                                              DATE:  1/13 Ex bike 6 min L10   SLE 3x12 4 lbs   Cable Walk 25lbs  x10 each direction  TKE 3x12  35 lbs   Knee extension machine 3x12 20 lbs  Airex step on forward and lateral x 20  1/7 Ex bike 6 min L10  LAQ 3x12 4 lbs  SLR 3x12 4 lbs    Cable Walk 25lbs  x10 each direction  TKE 3x12  25 lbs 1st set 35 lbs next 2   Manual: extension stretching     1/3  LF leg press 2x20 55 lbs  LF knee extension 10 lbs 3x10   Goblet squat 3x12 15 lbs TRX squat 3x12   Air-ex step on x20  Lateral step onto airr-ex  x20     Last visit:  Manual: assessed HEP  LAQ 5 lb 3x12  SLR 3x12 4 lbs   Leg press 3x12 90 lbs  [employer]  20lbs  TKE 3x12  25 lbs 1st set 35 lbs next 2      11/11/23 Bike level 10, 5 minutes for dynamic warm up Reassessment Lateral step down 4 inch 1 x 10, 6 inch 1 x 10  Mini squat slide out 2 x 5 Single leg hip hinge 2 x 10   11/09/23 Bike level 10, 5 minutes for dynamic warm up Step up 6 inch 2 x 10  Lateral step down 4 inch 3 x 10  Lunge 2 x 10 SLS on airex 3 x 30 second holds Manual: retrograde edema massage, scar mobilizations STS with RLE bias 1 x 10  12/20 Manual  passive range of motion into flexion and extension  SLR 3x10,  4 lbs 3x10            LAQ 5 lbs 3x15    TKE with cable 10 lbs 3x15  Cable walk x10 20 lbs   Reviewed and updated HEP   12/17 SLR 3x10, 2.5# SAQ 3x10, 4 #  LAQ 3x10 4 lbs   Step ups 6 inch 2x10  Exercise bike 5 min   Leg press 90 lbs 3x15 using RPE       PATIENT EDUCATION:  Education details: HEP, symptom management, edema management, progression of activities Person educated: Patient Education method: Explanation, Demonstration, Tactile cues, Verbal cues, and Handouts Education comprehension: verbalized understanding, returned demonstration, verbal cues required, tactile cues required, and needs further education  HOME EXERCISE PROGRAM: Access Code: [email or web address] URL: [email or web address] Date: 10/06/2023 Prepared by: Alm Don  ASSESSMENT:  CLINICAL IMPRESSION: Therapy continues to progress the patient's weight with the exercises.  He had a little bit of pain with extensions we continue to work on terminal knee extension control.  We advance his weight with knee extension machine.  We also added an Airex for stability work today.  He had no significant pain during treatment.  He is advised to monitor his pain over the next few hours.  Therapy will continue to progress as tolerated.   Eval hold patient is a 38 year old male status post right unicompartmental knee replacement on 10/03/2023.  At this time he presents with expected limitations in knee range of motion, strength, ability to ambulate, and general functional mobility.  He has significant swelling at this time.  His range of motion is limited in flexion.  He would benefit from skilled therapy to return to active lifestyle.  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 OBJECTIVE IMPAIRMENTS: Abnormal gait, decreased activity tolerance, decreased endurance, decreased mobility, difficulty walking, decreased ROM,  decreased strength, increased edema, and pain.   ACTIVITY LIMITATIONS: carrying, lifting, bending, standing, squatting, transfers, bed mobility, toileting, dressing, self feeding, and locomotion level  PARTICIPATION LIMITATIONS: meal prep, cleaning, laundry, driving, shopping, occupation, and yard work  PERSONAL FACTORS: None   REHAB POTENTIAL: Excellent  CLINICAL DECISION MAKING: Stable/uncomplicated  EVALUATION COMPLEXITY: Low   GOALS: Goals reviewed with patient? Yes  SHORT TERM GOALS: Target date: 11/03/2023   Patient will increase right knee strength by 5 lbs  Baseline: Goal status: goal adjusted not tested 12/6 11/11/23: likely improved since eval - MET  2.  Patient will demonstrate full knee extension Baseline:  Goal status: improving -2 12/6 11/11/23: 0 to 125 MET   3.  Patient will ambulate 500 feet without crutches without significant antalgic gait Baseline:  Goal status: working with cane; 11/11/23: MET  4.  Patient will be independent with base exercise program Baseline:  Goal status: Has base exercise program 12/20; MET  5.  Patient will demonstrate a 3 cm reduction in knee edema Goal status: MET  LONG TERM GOALS: Target date: 12/01/2023    Patient will go up and down 8 steps with reciprocal gait pattern Baseline:  Goal status: progressing stairs as tolerated 1/3  2.  Patient will ambulate community distances without pain Baseline:  Goal status: distance imprpving but not met 1/3 3.  Patient will return to jogging when cleared by MD Baseline:  Goal status: INITIAL  4.  Patient will be independent with complete exercise program Baseline:  Goal status: INITIAL  PLAN:  PT FREQUENCY: 1-2x/week  PT DURATION: 8 weeks  PLANNED INTERVENTIONS: 97110-Therapeutic exercises, 97530- Therapeutic activity, [id number]- Neuromuscular re-education, 97535- Self Care, [id number]- Manual therapy, [id number]- Gait training, [phone]- Aquatic Therapy, 97014- Electrical  stimulation (unattended), 97035- Ultrasound, Patient/Family education, Stair training, Taping, Dry Needling, DME instructions, Cryotherapy, and Moist heat   PLAN FOR NEXT SESSION: Consider vasopneumatic device next visit if he has continued swelling. Continue quad strengthening    Alm JINNY Don, PT 11/28/2023, 8:24 AM

## 2023-11-28 ENCOUNTER — Encounter (HOSPITAL_BASED_OUTPATIENT_CLINIC_OR_DEPARTMENT_OTHER): Payer: BC Managed Care – PPO | Admitting: Physical Therapy

## 2023-11-28 ENCOUNTER — Encounter (HOSPITAL_BASED_OUTPATIENT_CLINIC_OR_DEPARTMENT_OTHER): Payer: Self-pay | Admitting: Physical Therapy

## 2023-11-30 ENCOUNTER — Encounter (HOSPITAL_BASED_OUTPATIENT_CLINIC_OR_DEPARTMENT_OTHER): Payer: BC Managed Care – PPO | Admitting: Physical Therapy

## 2023-12-05 ENCOUNTER — Encounter (HOSPITAL_BASED_OUTPATIENT_CLINIC_OR_DEPARTMENT_OTHER): Payer: BC Managed Care – PPO | Admitting: Physical Therapy

## 2023-12-07 ENCOUNTER — Encounter (HOSPITAL_BASED_OUTPATIENT_CLINIC_OR_DEPARTMENT_OTHER): Payer: Self-pay | Admitting: Physical Therapy

## 2023-12-07 ENCOUNTER — Encounter (HOSPITAL_BASED_OUTPATIENT_CLINIC_OR_DEPARTMENT_OTHER): Payer: BC Managed Care – PPO | Admitting: Physical Therapy

## 2023-12-07 ENCOUNTER — Ambulatory Visit (HOSPITAL_BASED_OUTPATIENT_CLINIC_OR_DEPARTMENT_OTHER): Payer: 59 | Admitting: Physical Therapy

## 2023-12-07 DIAGNOSIS — M25561 Pain in right knee: Secondary | ICD-10-CM | POA: Diagnosis not present

## 2023-12-07 DIAGNOSIS — R600 Localized edema: Secondary | ICD-10-CM

## 2023-12-07 DIAGNOSIS — M25661 Stiffness of right knee, not elsewhere classified: Secondary | ICD-10-CM

## 2023-12-07 DIAGNOSIS — R2689 Other abnormalities of gait and mobility: Secondary | ICD-10-CM

## 2023-12-07 NOTE — Therapy (Signed)
OUTPATIENT PHYSICAL THERAPY LOWER EXTREMITY TREATMENT   Patient Name: Jorge Lee MRN: [id number] DOB:1986/03/23, 38 y.o., male Today's Date: 12/07/2023  Progress Note   Reporting Period 10/06/23 to 11/11/23   See note below for Objective Data and Assessment of Progress/Goals   END OF SESSION:  PT End of Session - 12/07/23 0935     Visit Number 15    Number of Visits 24    Date for PT Re-Evaluation 12/23/23    PT Start Time 0933    PT Stop Time 1015    PT Time Calculation (min) 42 min    Activity Tolerance Patient tolerated treatment well    Behavior During Therapy WFL for tasks assessed/performed               Past Medical History:  Diagnosis Date   Narcolepsy    PONV (postoperative nausea and vomiting)    Past Surgical History:  Procedure Laterality Date   KNEE ARTHROSCOPY     x2   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   Procedure: RIGHT  KNEE UNI ARTHROPLASTY;  Surgeon: Huel Cote, MD;  Location: [facility];  Service: Orthopedics;  Laterality: Right;   Patient Active Problem List   Diagnosis Date Noted   Unilateral primary osteoarthritis, right knee 10/03/2023   Baker's cyst of knee, right 09/09/2020   Osteochondritis dissecans of right knee 01/15/2020   Right foot drop 08/02/2019   Posterior right knee pain 12/05/2017    PCP: Leodis Sias MD  REFERRING PROVIDER: Dr Huel Cote   REFERRING DIAG: right UKA 10/03/2023  THERAPY DIAG:  Acute pain of right knee  Stiffness of right knee, not elsewhere classified  Localized edema  Other abnormalities of gait and mobility  Rationale for Evaluation and Treatment: [facility]   ONSET DATE: 10/03/2023  SUBJECTIVE:  Days since surgery: 65  SUBJECTIVE STATEMENT: The patient moving a lot better. Still some weakness/soreness with exercises, but improving.   Eval: Patient is a 38 year old male status post right unicompartmental knee replacement.  He has a long history of  right knee pain.  He is an avid runner.  His pain and been increasing to the point where he could not run.  At this time he is having significant pain and swelling particularly at night.  He was using crutches for primary ambulation.  PERTINENT HISTORY: No significant PMH PAIN:  Are you having pain? Yes: NPRS scale: 0/10 right now Pain location: right knee  Pain description: aching  Aggravating factors: night time  Relieving factors: pain meds   PRECAUTIONS: None  RED FLAGS: None   WEIGHT BEARING RESTRICTIONS: Yes WBAT  FALLS:  Has patient fallen in last 6 months? No  LIVING ENVIRONMENT: 10 steps inside   OCCUPATION:  Elementary school physical education teacher  Running:   PLOF: Independent  PATIENT GOALS: To have less pain and to return to running/return to work  NEXT MD VISIT:  Next week  OBJECTIVE:  Note: Objective measures were completed at Evaluation unless otherwise noted.  DIAGNOSTIC FINDINGS: Nothing postop  PATIENT SURVEYS:  FOTO 18% currently 62% expected in 20 visits  11/11/23: 59% function  COGNITION: Overall cognitive status: Within functional limits for tasks assessed     SENSATION: [facility]  EDEMA:  Circumferential: Right 49.4 left 41 cm 11/11/23: 42 cm    POSTURE: No Significant postural limitations  PALPATION:   LOWER EXTREMITY ROM:  Passive ROM Right eval Left eval  Right  12/4 Right  12/13 Right 11/09/23 Right 11/11/23  Hip flexion         Hip extension         Hip abduction         Hip adduction         Hip internal rotation         Hip external rotation         Knee flexion 38  85 100 115 121 125  Knee extension -6  -2    0  Ankle dorsiflexion         Ankle plantarflexion         Ankle inversion         Ankle eversion          (Blank rows = not tested)  LOWER EXTREMITY MMT:  MMT Right eval Left eval Right 11/11/23 Left 11/11/23  Hip flexion   62.8 70.4  Hip extension      Hip abduction      Hip adduction       Hip internal rotation      Hip external rotation      Knee flexion      Knee extension   50.5 104  Ankle dorsiflexion      Ankle plantarflexion      Ankle inversion      Ankle eversion       (Blank rows = not tested) not taken today secondary to significant swelling   GAIT: Reviewed how to weight-bear with 2 crutches.  Decreased weightbearing on right side compared to left.  Decreased right hip flexion.  We also reviewed how to use single crutch.  Patient advised not to progress to single crutch until he feels comfortable confident with 2 crutches. TODAY'S TREATMENT:                                                                                                                              DATE:  12/07/23 Elliptical level 4 5 minutes for dynamic warm up Cable Walk 25lbs  x10 retro Lateral step down 8 inch 2 x 10 Airex step on forward and lateral x 20 Discussion of dynamic exercises and performance of them  1/13 Ex bike 6 min L10   SLE 3x12 4 lbs   Cable Walk 25lbs  x10 each direction  TKE 3x12  35 lbs   Knee extension machine 3x12 20 lbs  Airex step on forward and lateral x 20  1/7 Ex bike 6 min L10  LAQ 3x12 4 lbs  SLR 3x12 4 lbs    Cable Walk 25lbs  x10 each direction  TKE 3x12  25 lbs 1st set 35 lbs next 2   Manual: extension stretching     1/3  LF leg press 2x20 55 lbs  LF knee extension 10 lbs 3x10   Goblet squat 3x12 15 lbs TRX squat 3x12   Air-ex step on x20  Lateral step onto airr-ex  x20     Last visit:  Manual: assessed HEP  LAQ 5 lb 3x12  SLR 3x12 4 lbs   Leg press 3x12 90 lbs  Cable Walk 20lbs  TKE 3x12  25 lbs 1st set 35 lbs next 2      11/11/23 Bike level 10, 5 minutes for dynamic warm up Reassessment Lateral step down 4 inch 1 x 10, 6 inch 1 x 10  Mini squat slide out 2 x 5 Single leg hip hinge 2 x 10   11/09/23 Bike level 10, 5 minutes for dynamic warm up Step up 6 inch 2 x 10  Lateral step down 4 inch 3 x 10  Lunge 2  x 10 SLS on airex 3 x 30 second holds Manual: retrograde edema massage, scar mobilizations STS with RLE bias 1 x 10  12/20 Manual passive range of motion into flexion and extension  SLR 3x10,  4 lbs 3x10            LAQ 5 lbs 3x15    TKE with cable 10 lbs 3x15  Cable walk x10 20 lbs   Reviewed and updated HEP   12/17 SLR 3x10, 2.5# SAQ 3x10, 4 #  LAQ 3x10 4 lbs   Step ups 6 inch 2x10  Exercise bike 5 min   Leg press 90 lbs 3x15 using RPE       PATIENT EDUCATION:  Education details: HEP, symptom management, edema management, progression of activities Person educated: Patient Education method: Explanation, Demonstration, Tactile cues, Verbal cues, and Handouts Education comprehension: verbalized understanding, returned demonstration, verbal cues required, tactile cues required, and needs further education  HOME EXERCISE PROGRAM: Access Code: [email or web address] URL: [email or web address] Date: 10/06/2023 Prepared by: Lorayne Bender  ASSESSMENT:  CLINICAL IMPRESSION: Patient able to tolerate elliptical today. Discussed exercise progressions and frequency of exercise. Continued with strengthening exercises. Light plymometric exercises performed on compliant surfaces with good mechanics.Discussion of dynamic exercises and performance of them. Patient will continue to benefit from physical therapy in order to improve function and reduce impairment.    Eval hold patient is a 38 year old male status post right unicompartmental knee replacement on 10/03/2023.  At this time he presents with expected limitations in knee range of motion, strength, ability to ambulate, and general functional mobility.  He has significant swelling at this time.  His range of motion is limited in flexion.  He would benefit from skilled therapy to return to active lifestyle.  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 OBJECTIVE IMPAIRMENTS: Abnormal gait, decreased activity  tolerance, decreased endurance, decreased mobility, difficulty walking, decreased ROM, decreased strength, increased edema, and pain.   ACTIVITY LIMITATIONS: carrying, lifting, bending, standing, squatting, transfers, bed mobility, toileting, dressing, self feeding, and locomotion level  PARTICIPATION LIMITATIONS: meal prep, cleaning, laundry, driving, shopping, occupation, and yard work  PERSONAL FACTORS: None   REHAB POTENTIAL: Excellent  CLINICAL DECISION MAKING: Stable/uncomplicated  EVALUATION COMPLEXITY: Low   GOALS: Goals reviewed with patient? Yes  SHORT TERM GOALS: Target date: 11/03/2023   Patient will increase right knee strength by 5 lbs  Baseline: Goal status: goal adjusted not tested 12/6 11/11/23: likely improved since eval - MET  2.  Patient will demonstrate full knee extension Baseline:  Goal status: improving -2 12/6 11/11/23: 0 to 125 MET   3.  Patient will ambulate 500 feet without crutches without significant antalgic gait Baseline:  Goal status: working with cane; 11/11/23: MET  4.  Patient will be independent with base exercise program Baseline:  Goal status: Has  base exercise program 12/20; MET  5.  Patient will demonstrate a 3 cm reduction in knee edema Goal status: MET  LONG TERM GOALS: Target date: 12/01/2023    Patient will go up and down 8 steps with reciprocal gait pattern Baseline:  Goal status: progressing stairs as tolerated 1/3  2.  Patient will ambulate community distances without pain Baseline:  Goal status: distance imprpving but not met 1/3 3.  Patient will return to jogging when cleared by MD Baseline:  Goal status: INITIAL  4.  Patient will be independent with complete exercise program Baseline:  Goal status: INITIAL  PLAN:  PT FREQUENCY: 1-2x/week  PT DURATION: 8 weeks  PLANNED INTERVENTIONS: 97110-Therapeutic exercises, 97530- Therapeutic activity, [id number]- Neuromuscular re-education, 97535- Self Care, 11914-  Manual therapy, [id number]- Gait training, [phone]- Aquatic Therapy, 97014- Electrical stimulation (unattended), 97035- Ultrasound, Patient/Family education, Stair training, Taping, Dry Needling, DME instructions, Cryotherapy, and Moist heat   PLAN FOR NEXT SESSION: Consider vasopneumatic device next visit if he has continued swelling. Continue quad strengthening    Wyman Songster, PT 12/07/2023, 10:15 AM

## 2023-12-12 ENCOUNTER — Ambulatory Visit (HOSPITAL_BASED_OUTPATIENT_CLINIC_OR_DEPARTMENT_OTHER): Payer: 59 | Admitting: Physical Therapy

## 2023-12-12 ENCOUNTER — Encounter (HOSPITAL_BASED_OUTPATIENT_CLINIC_OR_DEPARTMENT_OTHER): Payer: BC Managed Care – PPO | Admitting: Physical Therapy

## 2023-12-12 ENCOUNTER — Encounter (HOSPITAL_BASED_OUTPATIENT_CLINIC_OR_DEPARTMENT_OTHER): Payer: Self-pay | Admitting: Physical Therapy

## 2023-12-12 DIAGNOSIS — M25561 Pain in right knee: Secondary | ICD-10-CM

## 2023-12-12 DIAGNOSIS — R600 Localized edema: Secondary | ICD-10-CM

## 2023-12-12 DIAGNOSIS — M25661 Stiffness of right knee, not elsewhere classified: Secondary | ICD-10-CM

## 2023-12-12 DIAGNOSIS — R2689 Other abnormalities of gait and mobility: Secondary | ICD-10-CM

## 2023-12-12 NOTE — Therapy (Unsigned)
OUTPATIENT PHYSICAL THERAPY LOWER EXTREMITY TREATMENT   Patient Name: Jorge Lee MRN: [id number] DOB:[birth year], 38 y.o., male Today's Date: 12/12/2023  Progress Note   Reporting Period 10/06/23 to 11/11/23   See note below for Objective Data and Assessment of Progress/Goals   END OF SESSION:  PT End of Session - 12/12/23 0943     Visit Number 16    Number of Visits 32    Date for PT Re-Evaluation 02/06/24    PT Start Time 0932    PT Stop Time 1015    PT Time Calculation (min) 43 min    Activity Tolerance Patient tolerated treatment well    Behavior During Therapy WFL for tasks assessed/performed               Past Medical History:  Diagnosis Date   Narcolepsy    PONV (postoperative nausea and vomiting)    Past Surgical History:  Procedure Laterality Date   KNEE ARTHROSCOPY     x2   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   Procedure: RIGHT  KNEE UNI ARTHROPLASTY;  Surgeon: Huel Cote, MD;  Location: [facility];  Service: Orthopedics;  Laterality: Right;   Patient Active Problem List   Diagnosis Date Noted   Unilateral primary osteoarthritis, right knee 10/03/2023   Baker's cyst of knee, right 09/09/2020   Osteochondritis dissecans of right knee 01/15/2020   Right foot drop 08/02/2019   Posterior right knee pain 12/05/2017    PCP: Leodis Sias MD  REFERRING PROVIDER: Dr Huel Cote   REFERRING DIAG: right UKA 10/03/2023  THERAPY DIAG:  Acute pain of right knee  Stiffness of right knee, not elsewhere classified  Localized edema  Other abnormalities of gait and mobility  Rationale for Evaluation and Treatment: [facility]   ONSET DATE: 10/03/2023  SUBJECTIVE:  Days since surgery: 70  SUBJECTIVE STATEMENT: The patient moving a lot better. Still some weakness/soreness with exercises, but improving.   Eval: Patient is a 38 year old male status post right unicompartmental knee replacement.  He has a long history of  right knee pain.  He is an avid runner.  His pain and been increasing to the point where he could not run.  At this time he is having significant pain and swelling particularly at night.  He was using crutches for primary ambulation.  PERTINENT HISTORY: No significant PMH PAIN:  Are you having pain? Yes: NPRS scale: 0/10 right now Pain location: right knee  Pain description: aching  Aggravating factors: night time  Relieving factors: pain meds   PRECAUTIONS: None  RED FLAGS: None   WEIGHT BEARING RESTRICTIONS: Yes WBAT  FALLS:  Has patient fallen in last 6 months? No  LIVING ENVIRONMENT: 10 steps inside   OCCUPATION:  Elementary school physical education teacher  Running:   PLOF: Independent  PATIENT GOALS: To have less pain and to return to running/return to work  NEXT MD VISIT:  Next week  OBJECTIVE:  Note: Objective measures were completed at Evaluation unless otherwise noted.  DIAGNOSTIC FINDINGS: Nothing postop  PATIENT SURVEYS:  FOTO 18% currently 62% expected in 20 visits  11/11/23: 59% function  COGNITION: Overall cognitive status: Within functional limits for tasks assessed     SENSATION: [facility]  EDEMA:  Circumferential: Right 49.4 left 41 cm 11/11/23: 42 cm    POSTURE: No Significant postural limitations  PALPATION:   LOWER EXTREMITY ROM:  Passive ROM Right eval Left eval  Right  12/4 Right  12/13 Right 11/09/23 Right 1/28  Hip flexion         Hip extension         Hip abduction         Hip adduction         Hip internal rotation         Hip external rotation         Knee flexion 38  85 100 115 121 125  Knee extension -6  -2    0  Ankle dorsiflexion         Ankle plantarflexion         Ankle inversion         Ankle eversion          (Blank rows = not tested)  LOWER EXTREMITY MMT:  MMT Right eval Left eval Right 11/11/23 Left 11/11/23  Left   Hip flexion   62.8 70.4    Hip extension        Hip abduction        Hip  adduction        Hip internal rotation        Hip external rotation        Knee flexion        Knee extension   50.5 104    Ankle dorsiflexion        Ankle plantarflexion        Ankle inversion        Ankle eversion         (Blank rows = not tested) not taken today secondary to significant swelling   Left 90 124.3 45 84.6  Right 90 53.9  67% deficit   Left 45  74.4 12 % deficit    GAIT: Reviewed how to weight-bear with 2 crutches.  Decreased weightbearing on right side compared to left.  Decreased right hip flexion.  We also reviewed how to use single crutch.  Patient advised not to progress to single crutch until he feels comfortable confident with 2 crutches. TODAY'S TREATMENT:                                                                                                                              DATE:  1/28 Elliptical 6 min   Leg press 55 lbs 3x12  Knee extension 3x12  30 lbs   Tindq testing  Reviewed results   Deep squat TRX 2x20  Goblet squat 15 lbs 2x15     12/07/23 Elliptical level 4 5 minutes for dynamic warm up Cable Walk 25lbs  x10 retro Lateral step down 8 inch 2 x 10 Airex step on forward and lateral x 20 Discussion of dynamic exercises and performance of them  1/13 Ex bike 6 min L10   SLE 3x12 4 lbs   Cable Walk 25lbs  x10 each direction  TKE 3x12  35 lbs   Knee extension machine 3x12 20 lbs  Airex step on forward and lateral x 20  1/7 Ex bike 6 min L10  LAQ 3x12 4 lbs  SLR 3x12 4 lbs    Cable Walk 25lbs  x10 each direction  TKE 3x12  25 lbs 1st set 35 lbs next 2   Manual: extension stretching     1/3  LF leg press 2x20 55 lbs  LF knee extension 10 lbs 3x10   Goblet squat 3x12 15 lbs TRX squat 3x12   Air-ex step on x20  Lateral step onto airr-ex  x20     Last visit:  Manual: assessed HEP  LAQ 5 lb 3x12  SLR 3x12 4 lbs   Leg press 3x12 90 lbs  Cable Walk 20lbs  TKE 3x12  25 lbs 1st set 35 lbs next 2       11/11/23 Bike level 10, 5 minutes for dynamic warm up Reassessment Lateral step down 4 inch 1 x 10, 6 inch 1 x 10  Mini squat slide out 2 x 5 Single leg hip hinge 2 x 10   11/09/23 Bike level 10, 5 minutes for dynamic warm up Step up 6 inch 2 x 10  Lateral step down 4 inch 3 x 10  Lunge 2 x 10 SLS on airex 3 x 30 second holds Manual: retrograde edema massage, scar mobilizations STS with RLE bias 1 x 10  12/20 Manual passive range of motion into flexion and extension  SLR 3x10,  4 lbs 3x10            LAQ 5 lbs 3x15    TKE with cable 10 lbs 3x15  Cable walk x10 20 lbs   Reviewed and updated HEP   12/17 SLR 3x10, 2.5# SAQ 3x10, 4 #  LAQ 3x10 4 lbs   Step ups 6 inch 2x10  Exercise bike 5 min   Leg press 90 lbs 3x15 using RPE       PATIENT EDUCATION:  Education details: HEP, symptom management, edema management, progression of activities Person educated: Patient Education method: Explanation, Demonstration, Tactile cues, Verbal cues, and Handouts Education comprehension: verbalized understanding, returned demonstration, verbal cues required, tactile cues required, and needs further education  HOME EXERCISE PROGRAM: Access Code: [email or web address] URL: [email or web address] Date: 10/06/2023 Prepared by: Lorayne Bender  ASSESSMENT:  CLINICAL IMPRESSION: zThe patient continues to have a significant quad deficit as would be expected He is at a > 60 % limitation at 90 degrees. Prior to running we hpe to be more around a 20 % deficit. He has done well with the elliptical. He will continue working on advancing his exercises at he gym. We will continue to work on single leg stability exercises here. We will re-test him in about a month. He would benefit from further skilled therapy 1-2W8.    Eval hold patient is a 38 year old male status post right unicompartmental knee replacement on 10/03/2023.  At this time he presents with expected limitations in knee  range of motion, strength, ability to ambulate, and general functional mobility.  He has significant swelling at this time.  His range of motion is limited in flexion.  He would benefit from skilled therapy to return to active lifestyle.  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 OBJECTIVE IMPAIRMENTS: Abnormal gait, decreased activity tolerance, decreased endurance, decreased mobility, difficulty walking, decreased ROM, decreased strength, increased edema, and pain.   ACTIVITY LIMITATIONS: carrying, lifting, bending, standing, squatting, transfers, bed mobility, toileting, dressing, self feeding, and locomotion level  PARTICIPATION LIMITATIONS: meal prep, cleaning, laundry, driving, shopping, occupation,  and yard work  PERSONAL FACTORS: None   REHAB POTENTIAL: Excellent  CLINICAL DECISION MAKING: Stable/uncomplicated  EVALUATION COMPLEXITY: Low   GOALS: Goals reviewed with patient? Yes  SHORT TERM GOALS: Target date: 01/10/2024     Patient will increase right knee strength by 5 lbs  Baseline: Goal status: goal adjusted not tested 12/6 11/11/23: likely improved since eval - MET  2.  Patient will demonstrate full knee extension Baseline:  Goal status: improving -2 12/6 11/11/23: 0 to 125 MET   3.  Patient will ambulate 500 feet without crutches without significant antalgic gait Baseline:  Goal status: working with cane; 11/11/23: MET  4.  Patient will be independent with base exercise program Baseline:  Goal status: Has base exercise program 12/20; MET  5.  Patient will demonstrate a 3 cm reduction in knee edema Goal status: MET  LONG TERM GOALS: Target date:  01/24/2024      Patient will go up and down 8 steps with reciprocal gait pattern Baseline:  Goal status: progressing stairs as tolerated 1/3  2.  Patient will ambulate community distances without pain Baseline:  Goal status: distance imprpving but not met 1/3 3.  Patient will return  to jogging when cleared by MD Baseline:  Goal status: progressing 1/3  4.  Patient will be independent with complete exercise program Baseline:  Goal status: INITIAL  PLAN:  PT FREQUENCY: 1-2x/week  PT DURATION: 8 weeks  PLANNED INTERVENTIONS: 97110-Therapeutic exercises, 97530- Therapeutic activity, [id number]- Neuromuscular re-education, 97535- Self Care, [id number]- Manual therapy, [id number]- Gait training, [phone]- Aquatic Therapy, 97014- Electrical stimulation (unattended), 97035- Ultrasound, Patient/Family education, Stair training, Taping, Dry Needling, DME instructions, Cryotherapy, and Moist heat   PLAN FOR NEXT SESSION: Consider vasopneumatic device next visit if he has continued swelling. Continue quad strengthening    Dessie Coma, PT 12/12/2023, 1:59 PM

## 2023-12-13 ENCOUNTER — Encounter (HOSPITAL_BASED_OUTPATIENT_CLINIC_OR_DEPARTMENT_OTHER): Payer: Self-pay | Admitting: Physical Therapy

## 2023-12-14 ENCOUNTER — Encounter (HOSPITAL_BASED_OUTPATIENT_CLINIC_OR_DEPARTMENT_OTHER): Payer: BC Managed Care – PPO | Admitting: Physical Therapy

## 2023-12-18 ENCOUNTER — Ambulatory Visit (INDEPENDENT_AMBULATORY_CARE_PROVIDER_SITE_OTHER): Payer: 59 | Admitting: Orthopaedic Surgery

## 2023-12-18 ENCOUNTER — Ambulatory Visit (HOSPITAL_BASED_OUTPATIENT_CLINIC_OR_DEPARTMENT_OTHER): Payer: 59

## 2023-12-18 DIAGNOSIS — M1711 Unilateral primary osteoarthritis, right knee: Secondary | ICD-10-CM

## 2023-12-18 NOTE — Progress Notes (Signed)
Post Operative Evaluation    Procedure/Date of Surgery: Right knee unicompartmental knee arthroplasty 11/19  Interval History:   Presents today 12 weeks status post right knee partial knee replacement overall doing very well.  Overall he is having some pain going up and down stairs which is sore.  He is having a harder time for sitting for longer periods of time.  Predominantly the pain is deep behind the kneecap.  PMH/PSH/Family History/Social History/Meds/Allergies:    Past Medical History:  Diagnosis Date   Narcolepsy    PONV (postoperative nausea and vomiting)    Past Surgical History:  Procedure Laterality Date   KNEE ARTHROSCOPY     x2   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   Procedure: RIGHT  KNEE UNI ARTHROPLASTY;  Surgeon: Huel Cote, MD;  Location: [facility];  Service: Orthopedics;  Laterality: Right;   Social History   Socioeconomic History   Marital status: Married    Spouse name: Not on file   Number of children: Not on file   Years of education: Not on file   Highest education level: Not on file  Occupational History   Not on file  Tobacco Use   Smoking status: Never   Smokeless tobacco: Never  Vaping Use   Vaping status: Never Used  Substance and Sexual Activity   Alcohol use: Yes    Comment: occas   Drug use: No   Sexual activity: Not on file  Other Topics Concern   Not on file  Social History Narrative   Not on file   Social Drivers of Health   Financial Resource Strain: Not on file  Food Insecurity: Not on file  Transportation Needs: Not on file  Physical Activity: Not on file  Stress: Not on file  Social Connections: Not on file   No family history on file. No Known Allergies Current Outpatient Medications  Medication Sig Dispense Refill   amphetamine-dextroamphetamine (ADDERALL) 20 MG tablet Take 10 mg by mouth 2 (two) times daily.  0   aspirin EC 325 MG tablet Take 1 tablet  (325 mg total) by mouth daily. 30 tablet 0   ibuprofen (ADVIL) 800 MG tablet Take 1 tablet (800 mg total) by mouth every 8 (eight) hours as needed. 30 tablet 0   Multiple Vitamins-Minerals (MULTIVITAMIN WITH MINERALS) tablet Take 1 tablet by mouth daily.     oxyCODONE (ROXICODONE) 5 MG immediate release tablet Take 1 tablet (5 mg total) by mouth every 4 (four) hours as needed for severe pain (pain score 7-10) or breakthrough pain. 20 tablet 0   No current facility-administered medications for this visit.   No results found.  Review of Systems:   A ROS was performed including pertinent positives and negatives as documented in the HPI.   Musculoskeletal Exam:    There were no vitals taken for this visit.  Right knee incision is well-appearing without erythema or drainage.  Range of motion is 0 to 120 degrees.  This is no pain.  Mild quad atrophy.  Distal neurosensory exam is intact  Imaging:    4 views right knee: Status post unicompartmental medial knee arthroplasty without complication  I personally reviewed and interpreted the radiographs.   Assessment:   12 weeks status post right knee medial unicompartmental knee arthroplasty.  Overall he is  doing very well.  At this time he is having some patellofemoral symptoms in the setting of quadriceps weakness compared to the contralateral side.  He does have atrophy compared to the contralateral side.  I would like him to need to work on hip and quad strengthening's that we can hopefully get him some symptomatic relief.  In 4 weeks for reassessment.  He will remain out of work at that time  Plan :    -Return to clinic 4 weeks for reassessment      I personally saw and evaluated the patient, and participated in the management and treatment plan.  Huel Cote, MD Attending Physician, Orthopedic Surgery  This document was dictated using Dragon voice recognition software. A reasonable attempt at proof reading has been made to  minimize errors.

## 2023-12-19 ENCOUNTER — Encounter (HOSPITAL_BASED_OUTPATIENT_CLINIC_OR_DEPARTMENT_OTHER): Payer: Self-pay | Admitting: Physical Therapy

## 2023-12-19 ENCOUNTER — Encounter (HOSPITAL_BASED_OUTPATIENT_CLINIC_OR_DEPARTMENT_OTHER): Payer: Self-pay | Admitting: Orthopaedic Surgery

## 2023-12-19 ENCOUNTER — Ambulatory Visit (HOSPITAL_BASED_OUTPATIENT_CLINIC_OR_DEPARTMENT_OTHER): Payer: 59 | Attending: Orthopaedic Surgery | Admitting: Physical Therapy

## 2023-12-19 DIAGNOSIS — M25561 Pain in right knee: Secondary | ICD-10-CM | POA: Diagnosis present

## 2023-12-19 DIAGNOSIS — R600 Localized edema: Secondary | ICD-10-CM | POA: Insufficient documentation

## 2023-12-19 DIAGNOSIS — R2689 Other abnormalities of gait and mobility: Secondary | ICD-10-CM | POA: Diagnosis present

## 2023-12-19 DIAGNOSIS — M25661 Stiffness of right knee, not elsewhere classified: Secondary | ICD-10-CM | POA: Insufficient documentation

## 2023-12-19 NOTE — Therapy (Signed)
 OUTPATIENT PHYSICAL THERAPY LOWER EXTREMITY TREATMENT   Patient Name: Jorge Lee MRN: [id number] DOB:03/04/1986, 38 y.o., male Today's Date: 12/19/2023    END OF SESSION:  PT End of Session - 12/19/23 1020     Visit Number 17    Number of Visits 32    Date for PT Re-Evaluation 02/06/24    PT Start Time 1020    PT Stop Time 1100    PT Time Calculation (min) 40 min    Activity Tolerance Patient tolerated treatment well    Behavior During Therapy WFL for tasks assessed/performed               Past Medical History:  Diagnosis Date   Narcolepsy    PONV (postoperative nausea and vomiting)    Past Surgical History:  Procedure Laterality Date   KNEE ARTHROSCOPY     x2   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   Procedure: RIGHT  KNEE UNI ARTHROPLASTY;  Surgeon: Genelle Standing, MD;  Location: [facility];  Service: Orthopedics;  Laterality: Right;   Patient Active Problem List   Diagnosis Date Noted   Unilateral primary osteoarthritis, right knee 10/03/2023   Baker's cyst of knee, right 09/09/2020   Osteochondritis dissecans of right knee 01/15/2020   Right foot drop 08/02/2019   Posterior right knee pain 12/05/2017    PCP: Gwenn Shams MD  REFERRING PROVIDER: Dr Standing Genelle   REFERRING DIAG: right UKA 10/03/2023  THERAPY DIAG:  Acute pain of right knee  Stiffness of right knee, not elsewhere classified  Localized edema  Other abnormalities of gait and mobility  Rationale for Evaluation and Treatment: [facility]   ONSET DATE: 10/03/2023  SUBJECTIVE:  Days since surgery: 77  SUBJECTIVE STATEMENT: The patient workout out a few times last week, been walking more. Increase in knee pain lately. Quad feels weak.   Eval: Patient is a 38 year old male status post right unicompartmental knee replacement.  He has a long history of right knee pain.  He is an avid runner.  His pain and been increasing to the point where he could not run.  At  this time he is having significant pain and swelling particularly at night.  He was using crutches for primary ambulation.  PERTINENT HISTORY: No significant PMH PAIN:  Are you having pain? Yes: NPRS scale: 0/10 right now Pain location: right knee  Pain description: aching  Aggravating factors: night time  Relieving factors: pain meds   PRECAUTIONS: None  RED FLAGS: None   WEIGHT BEARING RESTRICTIONS: Yes WBAT  FALLS:  Has patient fallen in last 6 months? No  LIVING ENVIRONMENT: 10 steps inside   OCCUPATION:  Elementary school physical education teacher  Running:   PLOF: Independent  PATIENT GOALS: To have less pain and to return to running/return to work  NEXT MD VISIT:  Next week  OBJECTIVE:  Note: Objective measures were completed at Evaluation unless otherwise noted.  DIAGNOSTIC FINDINGS: Nothing postop  PATIENT SURVEYS:  FOTO 18% currently 62% expected in 20 visits  11/11/23: 59% function  COGNITION: Overall cognitive status: Within functional limits for tasks assessed     SENSATION: [facility]  EDEMA:  Circumferential: Right 49.4 left 41 cm 11/11/23: 42 cm    POSTURE: No Significant postural limitations  PALPATION:   LOWER EXTREMITY ROM:  Passive ROM Right eval Left eval  Right  12/4 Right  12/13 Right 11/09/23 Right 1/28  Hip flexion         Hip extension  Hip abduction         Hip adduction         Hip internal rotation         Hip external rotation         Knee flexion 38  85 100 115 121 125  Knee extension -6  -2    0  Ankle dorsiflexion         Ankle plantarflexion         Ankle inversion         Ankle eversion          (Blank rows = not tested)  LOWER EXTREMITY MMT:  MMT Right eval Left eval Right 11/11/23 Left 11/11/23  Left   Hip flexion   62.8 70.4    Hip extension        Hip abduction        Hip adduction        Hip internal rotation        Hip external rotation        Knee flexion        Knee extension    50.5 104    Ankle dorsiflexion        Ankle plantarflexion        Ankle inversion        Ankle eversion         (Blank rows = not tested) not taken today secondary to significant swelling   Left 90 124.3 45 84.6  Right 90 53.9  67% deficit   Left 45  74.4 12 % deficit    GAIT: Reviewed how to weight-bear with 2 crutches.  Decreased weightbearing on right side compared to left.  Decreased right hip flexion.  We also reviewed how to use single crutch.  Patient advised not to progress to single crutch until he feels comfortable confident with 2 crutches. TODAY'S TREATMENT:                                                                                                                              DATE:  12/19/23 Bike 5 minutes Lateral step down 6 inch 1 x 10 Sidelying hip abduction 2# 4 x 10 Sidelying shuttle 75# 3 x 10 Hip hike off step 2 x 10   1/28 Elliptical 6 min   Leg press 55 lbs 3x12  Knee extension 3x12  30 lbs   Tindq testing  Reviewed results   Deep squat TRX 2x20  Goblet squat 15 lbs 2x15     12/07/23 Elliptical level 4 5 minutes for dynamic warm up Cable Walk 25lbs  x10 retro Lateral step down 8 inch 2 x 10 Airex step on forward and lateral x 20 Discussion of dynamic exercises and performance of them  1/13 Ex bike 6 min L10   SLE 3x12 4 lbs   Cable Walk 25lbs  x10 each direction  TKE 3x12  35 lbs   Knee extension  machine 3x12 20 lbs  Airex step on forward and lateral x 20  1/7 Ex bike 6 min L10  LAQ 3x12 4 lbs  SLR 3x12 4 lbs    Cable Walk 25lbs  x10 each direction  TKE 3x12  25 lbs 1st set 35 lbs next 2   Manual: extension stretching     1/3  LF leg press 2x20 55 lbs  LF knee extension 10 lbs 3x10   Goblet squat 3x12 15 lbs TRX squat 3x12   Air-ex step on x20  Lateral step onto airr-ex  x20     Last visit:  Manual: assessed HEP  LAQ 5 lb 3x12  SLR 3x12 4 lbs   Leg press 3x12 90 lbs  Cable Walk 20lbs  TKE 3x12  25  lbs 1st set 35 lbs next 2      11/11/23 Bike level 10, 5 minutes for dynamic warm up Reassessment Lateral step down 4 inch 1 x 10, 6 inch 1 x 10  Mini squat slide out 2 x 5 Single leg hip hinge 2 x 10   11/09/23 Bike level 10, 5 minutes for dynamic warm up Step up 6 inch 2 x 10  Lateral step down 4 inch 3 x 10  Lunge 2 x 10 SLS on airex 3 x 30 second holds Manual: retrograde edema massage, scar mobilizations STS with RLE bias 1 x 10  12/20 Manual passive range of motion into flexion and extension  SLR 3x10,  4 lbs 3x10            LAQ 5 lbs 3x15    TKE with cable 10 lbs 3x15  Cable walk x10 20 lbs   Reviewed and updated HEP   12/17 SLR 3x10, 2.5# SAQ 3x10, 4 #  LAQ 3x10 4 lbs   Step ups 6 inch 2x10  Exercise bike 5 min   Leg press 90 lbs 3x15 using RPE       PATIENT EDUCATION:  Education details: HEP, symptom management, edema management, progression of activities Person educated: Patient Education method: Explanation, Demonstration, Tactile cues, Verbal cues, and Handouts Education comprehension: verbalized understanding, returned demonstration, verbal cues required, tactile cues required, and needs further education  HOME EXERCISE PROGRAM: Access Code: [email or web address] URL: [email or web address] Date: 10/06/2023 Prepared by: Alm Don  ASSESSMENT:  CLINICAL IMPRESSION: MD suggested use of BFR with therapy but BFR machine not functioning properly so could not use today. Patient demonstrating impaired glute strength and motor control with sttep down exercise with incrased valgus. Performed hip abd strengthening. Knee symptoms and mechanics improve with lateral step down with manual cueing for glute activation. Patient will continue to benefit from physical therapy in order to improve function and reduce impairment.     Eval hold patient is a 38 year old male status post right unicompartmental knee replacement on 10/03/2023.  At this time he  presents with expected limitations in knee range of motion, strength, ability to ambulate, and general functional mobility.  He has significant swelling at this time.  His range of motion is limited in flexion.  He would benefit from skilled therapy to return to active lifestyle.  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 OBJECTIVE IMPAIRMENTS: Abnormal gait, decreased activity tolerance, decreased endurance, decreased mobility, difficulty walking, decreased ROM, decreased strength, increased edema, and pain.   ACTIVITY LIMITATIONS: carrying, lifting, bending, standing, squatting, transfers, bed mobility, toileting, dressing, self feeding, and locomotion level  PARTICIPATION LIMITATIONS: meal prep, cleaning, laundry, driving, shopping,  occupation, and yard work  PERSONAL FACTORS: None   REHAB POTENTIAL: Excellent  CLINICAL DECISION MAKING: Stable/uncomplicated  EVALUATION COMPLEXITY: Low   GOALS: Goals reviewed with patient? Yes  SHORT TERM GOALS: Target date: 01/10/2024     Patient will increase right knee strength by 5 lbs  Baseline: Goal status: goal adjusted not tested 12/6 11/11/23: likely improved since eval - MET  2.  Patient will demonstrate full knee extension Baseline:  Goal status: improving -2 12/6 11/11/23: 0 to 125 MET   3.  Patient will ambulate 500 feet without crutches without significant antalgic gait Baseline:  Goal status: working with cane; 11/11/23: MET  4.  Patient will be independent with base exercise program Baseline:  Goal status: Has base exercise program 12/20; MET  5.  Patient will demonstrate a 3 cm reduction in knee edema Goal status: MET  LONG TERM GOALS: Target date:  01/24/2024      Patient will go up and down 8 steps with reciprocal gait pattern Baseline:  Goal status: progressing stairs as tolerated 1/3  2.  Patient will ambulate community distances without pain Baseline:  Goal status: distance  imprpving but not met 1/3 3.  Patient will return to jogging when cleared by MD Baseline:  Goal status: progressing 1/3  4.  Patient will be independent with complete exercise program Baseline:  Goal status: INITIAL  PLAN:  PT FREQUENCY: 1-2x/week  PT DURATION: 8 weeks  PLANNED INTERVENTIONS: 97110-Therapeutic exercises, 97530- Therapeutic activity, [id number]- Neuromuscular re-education, 97535- Self Care, [id number]- Manual therapy, [id number]- Gait training, [id number]- Aquatic Therapy, [id number]- Electrical stimulation (unattended), 97035- Ultrasound, Patient/Family education, Stair training, Taping, Dry Needling, DME instructions, Cryotherapy, and Moist heat   PLAN FOR NEXT SESSION: Consider vasopneumatic device next visit if he has continued swelling. Continue quad strengthening    Prentice GORMAN Stains, PT 12/19/2023, 10:21 AM

## 2023-12-20 ENCOUNTER — Encounter (HOSPITAL_BASED_OUTPATIENT_CLINIC_OR_DEPARTMENT_OTHER): Payer: 59 | Admitting: Orthopaedic Surgery

## 2023-12-21 ENCOUNTER — Encounter: Payer: Self-pay | Admitting: Podiatry

## 2023-12-21 ENCOUNTER — Ambulatory Visit: Payer: 59 | Admitting: Podiatry

## 2023-12-21 DIAGNOSIS — B351 Tinea unguium: Secondary | ICD-10-CM

## 2023-12-21 DIAGNOSIS — Z79899 Other long term (current) drug therapy: Secondary | ICD-10-CM

## 2023-12-21 DIAGNOSIS — Q666 Other congenital valgus deformities of feet: Secondary | ICD-10-CM

## 2023-12-21 DIAGNOSIS — M722 Plantar fascial fibromatosis: Secondary | ICD-10-CM | POA: Diagnosis not present

## 2023-12-21 NOTE — Patient Instructions (Addendum)
 Terbinafine  Tablets What is this medication? TERBINAFINE  (TER bin a feen) treats fungal infections of the nails. It belongs to a group of medications called antifungals. It will not treat infections caused by bacteria or viruses. This medicine may be used for other purposes; ask your health care provider or pharmacist if you have questions. COMMON BRAND NAME(S): Lamisil , Terbinex What should I tell my care team before I take this medication? They need to know if you have any of these conditions: Liver disease An unusual or allergic reaction to terbinafine , other medications, foods, dyes, or preservatives Pregnant or trying to get pregnant Breast-feeding How should I use this medication? Take this medication by mouth with water . Take it as directed on the prescription label at the same time every day. You can take it with or without food. If it upsets your stomach, take it with food. Keep taking it unless your care team tells you to stop. A special MedGuide will be given to you by the pharmacist with each prescription and refill. Be sure to read this information carefully each time. Talk to your care team about the use of this medication in children. Special care may be needed. Overdosage: If you think you have taken too much of this medicine contact a poison control center or emergency room at once. NOTE: This medicine is only for you. Do not share this medicine with others. What if I miss a dose? If you miss a dose, take it as soon as you can unless it is more than 4 hours late. If it is more than 4 hours late, skip the missed dose. Take the next dose at the normal time. What may interact with this medication? Do not take this medication with any of the following: Pimozide Thioridazine This medication may also interact with the following: Beta blockers Caffeine Certain medications for mental health conditions Cimetidine Cyclosporine Medications for fungal infections, such as fluconazole   or ketoconazole Medications for irregular heartbeat, such as amiodarone, flecainide, propafenone Rifampin Warfarin This list may not describe all possible interactions. Give your health care provider a list of all the medicines, herbs, non-prescription drugs, or dietary supplements you use. Also tell them if you smoke, drink alcohol, or use illegal drugs. Some items may interact with your medicine. What should I watch for while using this medication? Visit your care team for regular checks on your progress. Tell your care team if your symptoms do not start to get better or if they get worse. This medication may cause serious skin reactions. They can happen weeks to months after starting the medication. Contact your care team right away if you notice fevers or flu-like symptoms with a rash. The rash may be red or purple and then turn into blisters or peeling of the skin. You may also notice a red rash with swelling of the face, lips, or lymph nodes in your neck or under your arms. This medication can make you more sensitive to the sun. Keep out of the sun. If you cannot avoid being in the sun, wear protective clothing and sunscreen. Do not use sun lamps, tanning beds, or tanning booths. What side effects may I notice from receiving this medication? Side effects that you should report to your care team as soon as possible: Allergic reactions--skin rash, itching, hives, swelling of the face, lips, tongue, or throat Change in sense of smell Change in taste Infection--fever, chills, cough, or sore throat Liver injury--right upper belly pain, loss of appetite, nausea, light-colored stool, dark  yellow or brown urine, yellowing skin or eyes, unusual weakness or fatigue Low red blood cell level--unusual weakness or fatigue, dizziness, headache, trouble breathing Lupus-like syndrome--joint pain, swelling, or stiffness, butterfly-shaped rash on the face, rashes that get worse in the sun, fever, unusual  weakness or fatigue Rash, fever, and swollen lymph nodes Redness, blistering, peeling, or loosening of the skin, including inside the mouth Unusual bruising or bleeding Worsening mood, feelings of depression Side effects that usually do not require medical attention (report to your care team if they continue or are bothersome): Diarrhea Gas Headache Nausea Stomach pain Upset stomach This list may not describe all possible side effects. Call your doctor for medical advice about side effects. You may report side effects to FDA at 1-800-FDA-1088. Where should I keep my medication? Keep out of the reach of children and pets. Store between 20 and 25 degrees C (68 and 77 degrees F). Protect from light. Get rid of any unused medication after the expiration date. To get rid of medications that are no longer needed or have expired: Take the medication to a medication take-back program. Check with your pharmacy or law enforcement to find a location. If you cannot return the medication, check the label or package insert to see if the medication should be thrown out in the garbage or flushed down the toilet. If you are not sure, ask your care team. If it is safe to put it in the trash, take the medication out of the container. Mix the medication with cat litter, dirt, coffee grounds, or other unwanted substance. Seal the mixture in a bag or container. Put it in the trash. NOTE: This sheet is a summary. It may not cover all possible information. If you have questions about this medicine, talk to your doctor, pharmacist, or health care provider.  2024 Elsevier/Gold Standard (2023-05-19 00:00:00)  --   Plantar Fasciitis (Heel Spur Syndrome) with Rehab The plantar fascia is a fibrous, ligament-like, soft-tissue structure that spans the bottom of the foot. Plantar fasciitis is a condition that causes pain in the foot due to inflammation of the tissue. SYMPTOMS  Pain and tenderness on the underneath side  of the foot. Pain that worsens with standing or walking. CAUSES  Plantar fasciitis is caused by irritation and injury to the plantar fascia on the underneath side of the foot. Common mechanisms of injury include: Direct trauma to bottom of the foot. Damage to a small nerve that runs under the foot where the main fascia attaches to the heel bone. Stress placed on the plantar fascia due to bone spurs. RISK INCREASES WITH:  Activities that place stress on the plantar fascia (running, jumping, pivoting, or cutting). Poor strength and flexibility. Improperly fitted shoes. Tight calf muscles. Flat feet. Failure to warm-up properly before activity. Obesity. PREVENTION Warm up and stretch properly before activity. Allow for adequate recovery between workouts. Maintain physical fitness: Strength, flexibility, and endurance. Cardiovascular fitness. Maintain a health body weight. Avoid stress on the plantar fascia. Wear properly fitted shoes, including arch supports for individuals who have flat feet.  PROGNOSIS  If treated properly, then the symptoms of plantar fasciitis usually resolve without surgery. However, occasionally surgery is necessary.  RELATED COMPLICATIONS  Recurrent symptoms that may result in a chronic condition. Problems of the lower back that are caused by compensating for the injury, such as limping. Pain or weakness of the foot during push-off following surgery. Chronic inflammation, scarring, and partial or complete fascia tear, occurring more often from  repeated injections.  TREATMENT  Treatment initially involves the use of ice and medication to help reduce pain and inflammation. The use of strengthening and stretching exercises may help reduce pain with activity, especially stretches of the Achilles tendon. These exercises may be performed at home or with a therapist. Your caregiver may recommend that you use heel cups of arch supports to help reduce stress on the  plantar fascia. Occasionally, corticosteroid injections are given to reduce inflammation. If symptoms persist for greater than 6 months despite non-surgical (conservative), then surgery may be recommended.   MEDICATION  If pain medication is necessary, then nonsteroidal anti-inflammatory medications, such as aspirin and ibuprofen , or other minor pain relievers, such as acetaminophen , are often recommended. Do not take pain medication within 7 days before surgery. Prescription pain relievers may be given if deemed necessary by your caregiver. Use only as directed and only as much as you need. Corticosteroid injections may be given by your caregiver. These injections should be reserved for the most serious cases, because they may only be given a certain number of times.  HEAT AND COLD Cold treatment (icing) relieves pain and reduces inflammation. Cold treatment should be applied for 10 to 15 minutes every 2 to 3 hours for inflammation and pain and immediately after any activity that aggravates your symptoms. Use ice packs or massage the area with a piece of ice (ice massage). Heat treatment may be used prior to performing the stretching and strengthening activities prescribed by your caregiver, physical therapist, or athletic trainer. Use a heat pack or soak the injury in warm water .  SEEK IMMEDIATE MEDICAL CARE IF: Treatment seems to offer no benefit, or the condition worsens. Any medications produce adverse side effects.  EXERCISES- RANGE OF MOTION (ROM) AND STRETCHING EXERCISES - Plantar Fasciitis (Heel Spur Syndrome) These exercises may help you when beginning to rehabilitate your injury. Your symptoms may resolve with or without further involvement from your physician, physical therapist or athletic trainer. While completing these exercises, remember:  Restoring tissue flexibility helps normal motion to return to the joints. This allows healthier, less painful movement and activity. An  effective stretch should be held for at least 30 seconds. A stretch should never be painful. You should only feel a gentle lengthening or release in the stretched tissue.  RANGE OF MOTION - Toe Extension, Flexion Sit with your right / left leg crossed over your opposite knee. Grasp your toes and gently pull them back toward the top of your foot. You should feel a stretch on the bottom of your toes and/or foot. Hold this stretch for 10 seconds. Now, gently pull your toes toward the bottom of your foot. You should feel a stretch on the top of your toes and or foot. Hold this stretch for 10 seconds. Repeat  times. Complete this stretch 3 times per day.   RANGE OF MOTION - Ankle Dorsiflexion, Active Assisted Remove shoes and sit on a chair that is preferably not on a carpeted surface. Place right / left foot under knee. Extend your opposite leg for support. Keeping your heel down, slide your right / left foot back toward the chair until you feel a stretch at your ankle or calf. If you do not feel a stretch, slide your bottom forward to the edge of the chair, while still keeping your heel down. Hold this stretch for 10 seconds. Repeat 3 times. Complete this stretch 2 times per day.   STRETCH  Gastroc, Standing Place hands on wall. Extend  right / left leg, keeping the front knee somewhat bent. Slightly point your toes inward on your back foot. Keeping your right / left heel on the floor and your knee straight, shift your weight toward the wall, not allowing your back to arch. You should feel a gentle stretch in the right / left calf. Hold this position for 10 seconds. Repeat 3 times. Complete this stretch 2 times per day.  STRETCH  Soleus, Standing Place hands on wall. Extend right / left leg, keeping the other knee somewhat bent. Slightly point your toes inward on your back foot. Keep your right / left heel on the floor, bend your back knee, and slightly shift your weight over the back leg  so that you feel a gentle stretch deep in your back calf. Hold this position for 10 seconds. Repeat 3 times. Complete this stretch 2 times per day.  STRETCH  Gastrocsoleus, Standing  Note: This exercise can place a lot of stress on your foot and ankle. Please complete this exercise only if specifically instructed by your caregiver.  Place the ball of your right / left foot on a step, keeping your other foot firmly on the same step. Hold on to the wall or a rail for balance. Slowly lift your other foot, allowing your body weight to press your heel down over the edge of the step. You should feel a stretch in your right / left calf. Hold this position for 10 seconds. Repeat this exercise with a slight bend in your right / left knee. Repeat 3 times. Complete this stretch 2 times per day.   STRENGTHENING EXERCISES - Plantar Fasciitis (Heel Spur Syndrome)  These exercises may help you when beginning to rehabilitate your injury. They may resolve your symptoms with or without further involvement from your physician, physical therapist or athletic trainer. While completing these exercises, remember:  Muscles can gain both the endurance and the strength needed for everyday activities through controlled exercises. Complete these exercises as instructed by your physician, physical therapist or athletic trainer. Progress the resistance and repetitions only as guided.  STRENGTH - Towel Curls Sit in a chair positioned on a non-carpeted surface. Place your foot on a towel, keeping your heel on the floor. Pull the towel toward your heel by only curling your toes. Keep your heel on the floor. Repeat 3 times. Complete this exercise 2 times per day.  STRENGTH - Ankle Inversion Secure one end of a rubber exercise band/tubing to a fixed object (table, pole). Loop the other end around your foot just before your toes. Place your fists between your knees. This will focus your strengthening at your ankle. Slowly,  pull your big toe up and in, making sure the band/tubing is positioned to resist the entire motion. Hold this position for 10 seconds. Have your muscles resist the band/tubing as it slowly pulls your foot back to the starting position. Repeat 3 times. Complete this exercises 2 times per day.  Document Released: 10/31/2005 Document Revised: 01/23/2012 Document Reviewed: 02/12/2009 [facility] Patient Information 2014 [place], [place].

## 2023-12-21 NOTE — Progress Notes (Signed)
 Subjective:   Patient ID: Jorge Lee, male   DOB: 38 y.o.   MRN: [id number]   HPI Chief Complaint  Patient presents with   Nail Problem    RM#11 Bilateral big toe nail issues X 1 year patient recently had knee surgery 10/03/2023  and states having itchiness and pain on the tips of toes.States he is has flat feet . Patient is currently still out of work so not as active as he normally is.   38 year old male presents the office with above concerns.  Main concern is he gets thickening, discoloration to his big toenails.  Currently no drainage or pus coming from this area.  He also had concerns that he was getting blisters to the tips of his toes has not resolved.  Not sure how this started.  Currently no open lesions.  He also had concerns of pain to the arch of his foot.  He did get some over-the-counter inserts.  No injuries.  Review of Systems  All other systems reviewed and are negative.  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   Procedure: RIGHT  KNEE UNI ARTHROPLASTY;  Surgeon: Genelle Standing, MD;  Location: [facility];  Service: Orthopedics;  Laterality: Right;     Current Outpatient Medications:    amphetamine-dextroamphetamine (ADDERALL) 20 MG tablet, Take 10 mg by mouth 2 (two) times daily., Disp: , Rfl: 0   aspirin  EC 325 MG tablet, Take 1 tablet (325 mg total) by mouth daily., Disp: 30 tablet, Rfl: 0   ibuprofen  (ADVIL ) 800 MG tablet, Take 1 tablet (800 mg total) by mouth every 8 (eight) hours as needed., Disp: 30 tablet, Rfl: 0   Multiple Vitamins-Minerals (MULTIVITAMIN WITH MINERALS) tablet, Take 1 tablet by mouth daily., Disp: , Rfl:    oxyCODONE  (ROXICODONE ) 5 MG immediate release tablet, Take 1 tablet (5 mg total) by mouth every 4 (four) hours as needed for severe pain  (pain score 7-10) or breakthrough pain., Disp: 20 tablet, Rfl: 0   terbinafine  (LAMISIL ) 250 MG tablet, Take 1 tablet (250 mg total) by mouth daily., Disp: 90 tablet, Rfl: 0  No Known Allergies        Objective:  Physical Exam  General: AAO x3, NAD  Dermatological: Left hallux nails are hypertrophic, dystrophic with yellow, brown discoloration.  There is no edema, erythema or signs of infection.  There is no open lesions identified.  Areas of blisters that he shown pictures of has resolved to the tips of his toes.  Vascular: Dorsalis Pedis artery and Posterior Tibial artery pedal pulses are 2/4 bilateral with immedate capillary fill time. There is no pain with calf compression, swelling, warmth, erythema.   Neruologic: Grossly intact via light touch bilateral.   Musculoskeletal: There is mild discomfort on the medial band plantar fascia in the arch of the foot bilaterally.  Not able to appreciate any area pinpoint tenderness.  No edema, erythema.  Flexor, extensor tendons are intact.  MMT 5/5.  Flatfoot, digital contractures present  Gait: Unassisted, Nonantalgic.       Assessment:   38 year old male with onychomycosis, plantar fasciitis, resolved blisters     Plan:  -Treatment options discussed including all alternatives, risks, and complications -Etiology of symptoms were discussed  Onychomycosis -We discussed oral, topical as well  as alternative treatments.  After discussion was proceed with oral Lamisil .  Discussed side effects, success rates.  Check CBC, LFT prior to starting medication.  Blisters -These have resolved.  Monitor any reoccurrence.  Arch pain, plantar fasciitis -We discussed stretching, icing on a regular basis.  We discussed shoes, to support.  He is personnel officer, pedorthist for measurement of orthotics.  Anti-inflammatories as needed.  Donnice JONELLE Fees DPM

## 2023-12-22 ENCOUNTER — Encounter: Payer: Self-pay | Admitting: Podiatry

## 2023-12-22 ENCOUNTER — Other Ambulatory Visit: Payer: Self-pay | Admitting: Podiatry

## 2023-12-22 DIAGNOSIS — Z79899 Other long term (current) drug therapy: Secondary | ICD-10-CM

## 2023-12-22 LAB — CBC WITH DIFFERENTIAL/PLATELET
Basophils Absolute: 0.1 10*3/uL (ref 0.0–0.2)
Basos: 1 %
EOS (ABSOLUTE): 0.1 10*3/uL (ref 0.0–0.4)
Eos: 1 %
Hematocrit: 46.6 % (ref 37.5–51.0)
Hemoglobin: 15.9 g/dL (ref 13.0–17.7)
Immature Grans (Abs): 0 10*3/uL (ref 0.0–0.1)
Immature Granulocytes: 0 %
Lymphocytes Absolute: 2.5 10*3/uL (ref 0.7–3.1)
Lymphs: 25 %
MCH: 30.7 pg (ref 26.6–33.0)
MCHC: 34.1 g/dL (ref 31.5–35.7)
MCV: 90 fL (ref 79–97)
Monocytes Absolute: 0.8 10*3/uL (ref 0.1–0.9)
Monocytes: 8 %
Neutrophils Absolute: 6.4 10*3/uL (ref 1.4–7.0)
Neutrophils: 65 %
Platelets: 322 10*3/uL (ref 150–450)
RBC: 5.18 x10E6/uL (ref 4.14–5.80)
RDW: 12 % (ref 11.6–15.4)
WBC: 10 10*3/uL (ref 3.4–10.8)

## 2023-12-22 LAB — HEPATIC FUNCTION PANEL
ALT: 32 [IU]/L (ref 0–44)
AST: 32 [IU]/L (ref 0–40)
Albumin: 4.7 g/dL (ref 4.1–5.1)
Alkaline Phosphatase: 88 [IU]/L (ref 44–121)
Bilirubin Total: 0.7 mg/dL (ref 0.0–1.2)
Bilirubin, Direct: 0.21 mg/dL (ref 0.00–0.40)
Total Protein: 6.9 g/dL (ref 6.0–8.5)

## 2023-12-22 MED ORDER — TERBINAFINE HCL 250 MG PO TABS: 250.0000 mg | ORAL_TABLET | Freq: Every day | ORAL | 0 refills | Status: AC

## 2023-12-22 NOTE — Progress Notes (Signed)
 Orthotics   Patient was present and evaluated for Custom molded foot orthotics. Patient will benefit from CFO's to provide total contact to BIL MLA's helping to balance and distribute body weight more evenly across BIL feet helping to reduce plantar pressure and pain. Orthotic will also encourage FF / RF alignment  Patient was scanned today and will return for fitting upon receipt

## 2023-12-25 ENCOUNTER — Encounter (HOSPITAL_BASED_OUTPATIENT_CLINIC_OR_DEPARTMENT_OTHER): Payer: 59 | Admitting: Orthopaedic Surgery

## 2023-12-26 ENCOUNTER — Encounter (HOSPITAL_BASED_OUTPATIENT_CLINIC_OR_DEPARTMENT_OTHER): Payer: 59 | Admitting: Physical Therapy

## 2023-12-28 ENCOUNTER — Encounter (HOSPITAL_BASED_OUTPATIENT_CLINIC_OR_DEPARTMENT_OTHER): Payer: Self-pay | Admitting: Physical Therapy

## 2023-12-28 ENCOUNTER — Ambulatory Visit (HOSPITAL_BASED_OUTPATIENT_CLINIC_OR_DEPARTMENT_OTHER): Payer: 59 | Admitting: Physical Therapy

## 2023-12-28 DIAGNOSIS — M25561 Pain in right knee: Secondary | ICD-10-CM | POA: Diagnosis not present

## 2023-12-28 DIAGNOSIS — R2689 Other abnormalities of gait and mobility: Secondary | ICD-10-CM

## 2023-12-28 DIAGNOSIS — M25661 Stiffness of right knee, not elsewhere classified: Secondary | ICD-10-CM

## 2023-12-28 DIAGNOSIS — R600 Localized edema: Secondary | ICD-10-CM

## 2023-12-28 NOTE — Therapy (Signed)
OUTPATIENT PHYSICAL THERAPY LOWER EXTREMITY TREATMENT   Patient Name: Jorge Lee MRN: [id number] DOB:[birth year], 38 y.o., male Today's Date: 12/29/2023    END OF SESSION:  PT End of Session - 12/28/23 1351     Visit Number 18    Number of Visits 32    Date for PT Re-Evaluation 02/06/24    PT Start Time 1145    PT Stop Time 1227    PT Time Calculation (min) 42 min    Activity Tolerance Patient tolerated treatment well    Behavior During Therapy WFL for tasks assessed/performed                Past Medical History:  Diagnosis Date   Narcolepsy    PONV (postoperative nausea and vomiting)    Past Surgical History:  Procedure Laterality Date   KNEE ARTHROSCOPY     x2   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   Procedure: RIGHT  KNEE UNI ARTHROPLASTY;  Surgeon: Huel Cote, MD;  Location: [facility];  Service: Orthopedics;  Laterality: Right;   Patient Active Problem List   Diagnosis Date Noted   Unilateral primary osteoarthritis, right knee 10/03/2023   Baker's cyst of knee, right 09/09/2020   Osteochondritis dissecans of right knee 01/15/2020   Right foot drop 08/02/2019   Posterior right knee pain 12/05/2017    PCP: Leodis Sias MD  REFERRING PROVIDER: Dr Huel Cote   REFERRING DIAG: right UKA 10/03/2023  THERAPY DIAG:  Acute pain of right knee  Stiffness of right knee, not elsewhere classified  Localized edema  Other abnormalities of gait and mobility  Rationale for Evaluation and Treatment: [facility]   ONSET DATE: 10/03/2023  SUBJECTIVE:  Days since surgery: 86  SUBJECTIVE STATEMENT: The patient reports the pain has resolved. He continues to have weakness in his knee.  Eval: Patient is a 38 year old male status post right unicompartmental knee replacement.  He has a long history of right knee pain.  He is an avid runner.  His pain and been increasing to the point where he could not run.  At this time he is having  significant pain and swelling particularly at night.  He was using crutches for primary ambulation.  PERTINENT HISTORY: No significant PMH PAIN:  Are you having pain? Yes: NPRS scale: 0/10 right now Pain location: right knee  Pain description: aching  Aggravating factors: night time  Relieving factors: pain meds   PRECAUTIONS: None  RED FLAGS: None   WEIGHT BEARING RESTRICTIONS: Yes WBAT  FALLS:  Has patient fallen in last 6 months? No  LIVING ENVIRONMENT: 10 steps inside   OCCUPATION:  Elementary school physical education teacher  Running:   PLOF: Independent  PATIENT GOALS: To have less pain and to return to running/return to work  NEXT MD VISIT:  Next week  OBJECTIVE:  Note: Objective measures were completed at Evaluation unless otherwise noted.  DIAGNOSTIC FINDINGS: Nothing postop  PATIENT SURVEYS:  FOTO 18% currently 62% expected in 20 visits  11/11/23: 59% function  COGNITION: Overall cognitive status: Within functional limits for tasks assessed     SENSATION: [facility]  EDEMA:  Circumferential: Right 49.4 left 41 cm 11/11/23: 42 cm    POSTURE: No Significant postural limitations  PALPATION:   LOWER EXTREMITY ROM:  Passive ROM Right eval Left eval  Right  12/4 Right  12/13 Right 11/09/23 Right 1/28  Hip flexion         Hip extension  Hip abduction         Hip adduction         Hip internal rotation         Hip external rotation         Knee flexion 38  85 100 115 121 125  Knee extension -6  -2    0  Ankle dorsiflexion         Ankle plantarflexion         Ankle inversion         Ankle eversion          (Blank rows = not tested)  LOWER EXTREMITY MMT:  MMT Right eval Left eval Right 11/11/23 Left 11/11/23  Left   Hip flexion   62.8 70.4    Hip extension        Hip abduction        Hip adduction        Hip internal rotation        Hip external rotation        Knee flexion        Knee extension   50.5 104    Ankle  dorsiflexion        Ankle plantarflexion        Ankle inversion        Ankle eversion         (Blank rows = not tested) not taken today secondary to significant swelling   Left 90 124.3 45 84.6  Right 90 53.9  67% deficit   Left 45  74.4 12 % deficit    GAIT: Reviewed how to weight-bear with 2 crutches.  Decreased weightbearing on right side compared to left.  Decreased right hip flexion.  We also reviewed how to use single crutch.  Patient advised not to progress to single crutch until he feels comfortable confident with 2 crutches. TODAY'S TREATMENT:                                                                                                                              DATE:   There-ex: Bike 5 min for warm up  SL SLR 3 lbs 3x12 Leg press 3x12 100 lbs  Sidelying shuttle 75# 3 x 10 Knee extension 3x12  30 lbs There-Act  Self Care  Nuro-Re-ed  Cable walk 45 lbs 10x fwd and back  Eccentric step down 4 inch 2x15 sec hold    12/19/23 Bike 5 minutes Lateral step down 6 inch 1 x 10 Sidelying hip abduction 2# 4 x 10 Sidelying shuttle 75# 3 x 10 Hip hike off step 2 x 10   1/28 Elliptical 6 min   Leg press 55 lbs 3x12  Knee extension 3x12  30 lbs   Tindq testing  Reviewed results   Deep squat TRX 2x20  Goblet squat 15 lbs 2x15     12/07/23 Elliptical level 4 5 minutes for dynamic warm up Cable Walk 25lbs  x10 retro Lateral step down 8 inch 2 x 10 Airex step on forward and lateral x 20 Discussion of dynamic exercises and performance of them  1/13 Ex bike 6 min L10   SLE 3x12 4 lbs   Cable Walk 25lbs  x10 each direction  TKE 3x12  35 lbs   Knee extension machine 3x12 20 lbs  Airex step on forward and lateral x 20  1/7 Ex bike 6 min L10  LAQ 3x12 4 lbs  SLR 3x12 4 lbs    Cable Walk 25lbs  x10 each direction  TKE 3x12  25 lbs 1st set 35 lbs next 2   Manual: extension stretching     1/3  LF leg press 2x20 55 lbs  LF knee extension 10 lbs  3x10   Goblet squat 3x12 15 lbs TRX squat 3x12   Air-ex step on x20  Lateral step onto airr-ex  x20     Last visit:  Manual: assessed HEP  LAQ 5 lb 3x12  SLR 3x12 4 lbs   Leg press 3x12 90 lbs  Cable Walk 20lbs  TKE 3x12  25 lbs 1st set 35 lbs next 2      11/11/23 Bike level 10, 5 minutes for dynamic warm up Reassessment Lateral step down 4 inch 1 x 10, 6 inch 1 x 10  Mini squat slide out 2 x 5 Single leg hip hinge 2 x 10   11/09/23 Bike level 10, 5 minutes for dynamic warm up Step up 6 inch 2 x 10  Lateral step down 4 inch 3 x 10  Lunge 2 x 10 SLS on airex 3 x 30 second holds Manual: retrograde edema massage, scar mobilizations STS with RLE bias 1 x 10  12/20 Manual passive range of motion into flexion and extension  SLR 3x10,  4 lbs 3x10            LAQ 5 lbs 3x15    TKE with cable 10 lbs 3x15  Cable walk x10 20 lbs   Reviewed and updated HEP   12/17 SLR 3x10, 2.5# SAQ 3x10, 4 #  LAQ 3x10 4 lbs   Step ups 6 inch 2x10  Exercise bike 5 min   Leg press 90 lbs 3x15 using RPE       PATIENT EDUCATION:  Education details: HEP, symptom management, edema management, progression of activities Person educated: Patient Education method: Explanation, Demonstration, Tactile cues, Verbal cues, and Handouts Education comprehension: verbalized understanding, returned demonstration, verbal cues required, tactile cues required, and needs further education  HOME EXERCISE PROGRAM: Access Code: [email or web address] URL: [email or web address] Date: 10/06/2023 Prepared by: Lorayne Bender  ASSESSMENT:  CLINICAL IMPRESSION: BFR continues to not work. Therapy focused on gluteal strengthening and eccentirc control with exercises. He had no pain with exercises today. We continue to progressively load his activity.    Eval hold patient is a 38 year old male status post right unicompartmental knee replacement on 10/03/2023.  At this time he presents with expected  limitations in knee range of motion, strength, ability to ambulate, and general functional mobility.  He has significant swelling at this time.  His range of motion is limited in flexion.  He would benefit from skilled therapy to return to active lifestyle.  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 OBJECTIVE IMPAIRMENTS: Abnormal gait, decreased activity tolerance, decreased endurance, decreased mobility, difficulty walking, decreased ROM, decreased strength, increased edema, and pain.   ACTIVITY LIMITATIONS: carrying, lifting, bending, standing, squatting, transfers,  bed mobility, toileting, dressing, self feeding, and locomotion level  PARTICIPATION LIMITATIONS: meal prep, cleaning, laundry, driving, shopping, occupation, and yard work  PERSONAL FACTORS: None   REHAB POTENTIAL: Excellent  CLINICAL DECISION MAKING: Stable/uncomplicated  EVALUATION COMPLEXITY: Low   GOALS: Goals reviewed with patient? Yes  SHORT TERM GOALS: Target date: 01/10/2024     Patient will increase right knee strength by 5 lbs  Baseline: Goal status: goal adjusted not tested 12/6 11/11/23: likely improved since eval - MET  2.  Patient will demonstrate full knee extension Baseline:  Goal status: improving -2 12/6 11/11/23: 0 to 125 MET   3.  Patient will ambulate 500 feet without crutches without significant antalgic gait Baseline:  Goal status: working with cane; 11/11/23: MET  4.  Patient will be independent with base exercise program Baseline:  Goal status: Has base exercise program 12/20; MET  5.  Patient will demonstrate a 3 cm reduction in knee edema Goal status: MET  LONG TERM GOALS: Target date:  01/24/2024      Patient will go up and down 8 steps with reciprocal gait pattern Baseline:  Goal status: progressing stairs as tolerated 1/3  2.  Patient will ambulate community distances without pain Baseline:  Goal status: distance imprpving but not met 1/3 3.   Patient will return to jogging when cleared by MD Baseline:  Goal status: progressing 1/3  4.  Patient will be independent with complete exercise program Baseline:  Goal status: INITIAL  PLAN:  PT FREQUENCY: 1-2x/week  PT DURATION: 8 weeks  PLANNED INTERVENTIONS: 97110-Therapeutic exercises, 97530- Therapeutic activity, [id number]- Neuromuscular re-education, 97535- Self Care, [id number]- Manual therapy, [id number]- Gait training, [phone]- Aquatic Therapy, 97014- Electrical stimulation (unattended), 97035- Ultrasound, Patient/Family education, Stair training, Taping, Dry Needling, DME instructions, Cryotherapy, and Moist heat   PLAN FOR NEXT SESSION: Consider vasopneumatic device next visit if he has continued swelling. Continue quad strengthening    Dessie Coma, PT 12/29/2023, 10:04 AM

## 2024-01-02 ENCOUNTER — Encounter (HOSPITAL_BASED_OUTPATIENT_CLINIC_OR_DEPARTMENT_OTHER): Payer: Self-pay | Admitting: Physical Therapy

## 2024-01-03 ENCOUNTER — Ambulatory Visit (HOSPITAL_BASED_OUTPATIENT_CLINIC_OR_DEPARTMENT_OTHER): Payer: 59 | Admitting: Physical Therapy

## 2024-01-03 ENCOUNTER — Encounter (HOSPITAL_BASED_OUTPATIENT_CLINIC_OR_DEPARTMENT_OTHER): Payer: 59 | Admitting: Physical Therapy

## 2024-01-03 ENCOUNTER — Encounter (HOSPITAL_BASED_OUTPATIENT_CLINIC_OR_DEPARTMENT_OTHER): Payer: Self-pay | Admitting: Physical Therapy

## 2024-01-03 DIAGNOSIS — R600 Localized edema: Secondary | ICD-10-CM

## 2024-01-03 DIAGNOSIS — R2689 Other abnormalities of gait and mobility: Secondary | ICD-10-CM

## 2024-01-03 DIAGNOSIS — M25661 Stiffness of right knee, not elsewhere classified: Secondary | ICD-10-CM

## 2024-01-03 DIAGNOSIS — M25561 Pain in right knee: Secondary | ICD-10-CM

## 2024-01-03 NOTE — Therapy (Signed)
 OUTPATIENT PHYSICAL THERAPY LOWER EXTREMITY TREATMENT   Patient Name: Jorge Lee MRN: [id number] DOB:October 10, 1986, 38 y.o., male Today's Date: 01/03/2024    END OF SESSION:  PT End of Session - 01/03/24 0810     Visit Number 19    Number of Visits 32    Date for PT Re-Evaluation 02/06/24    PT Start Time 0715    PT Stop Time 0759    PT Time Calculation (min) 44 min    Activity Tolerance Patient tolerated treatment well    Behavior During Therapy WFL for tasks assessed/performed                Past Medical History:  Diagnosis Date   Narcolepsy    PONV (postoperative nausea and vomiting)    Past Surgical History:  Procedure Laterality Date   KNEE ARTHROSCOPY     x2   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   Procedure: RIGHT  KNEE UNI ARTHROPLASTY;  Surgeon: Huel Cote, MD;  Location: [facility];  Service: Orthopedics;  Laterality: Right;   Patient Active Problem List   Diagnosis Date Noted   Unilateral primary osteoarthritis, right knee 10/03/2023   Baker's cyst of knee, right 09/09/2020   Osteochondritis dissecans of right knee 01/15/2020   Right foot drop 08/02/2019   Posterior right knee pain 12/05/2017    PCP: Leodis Sias MD  REFERRING PROVIDER: Dr Huel Cote   REFERRING DIAG: right UKA 10/03/2023  THERAPY DIAG:  Acute pain of right knee  Stiffness of right knee, not elsewhere classified  Localized edema  Other abnormalities of gait and mobility  Rationale for Evaluation and Treatment: [facility]   ONSET DATE: 10/03/2023  SUBJECTIVE:  Days since surgery: 92  SUBJECTIVE STATEMENT: Patient reports the pain has been better.  He is having no significant pain.  We discussed exercises he feels like it swollen.  He has been working hard on at [employer]  Plans to go back to work March 6.  Eval: Patient is a 38 year old male status post right unicompartmental knee replacement.  He has a long history of right knee pain.   He is an avid runner.  His pain and been increasing to the point where he could not run.  At this time he is having significant pain and swelling particularly at night.  He was using crutches for primary ambulation.  PERTINENT HISTORY: No significant PMH PAIN:  Are you having pain? Yes: NPRS scale: 0/10 right now Pain location: right knee  Pain description: aching  Aggravating factors: night time  Relieving factors: pain meds   PRECAUTIONS: None  RED FLAGS: None   WEIGHT BEARING RESTRICTIONS: Yes WBAT  FALLS:  Has patient fallen in last 6 months? No  LIVING ENVIRONMENT: 10 steps inside   OCCUPATION:  Elementary school physical education teacher  Running:   PLOF: Independent  PATIENT GOALS: To have less pain and to return to running/return to work  NEXT MD VISIT:  Next week  OBJECTIVE:  Note: Objective measures were completed at Evaluation unless otherwise noted.  DIAGNOSTIC FINDINGS: Nothing postop  PATIENT SURVEYS:  FOTO 18% currently 62% expected in 20 visits  11/11/23: 59% function  COGNITION: Overall cognitive status: Within functional limits for tasks assessed     SENSATION: [facility]  EDEMA:  Circumferential: Right 49.4 left 41 cm 11/11/23: 42 cm    POSTURE: No Significant postural limitations  PALPATION:   LOWER EXTREMITY ROM:  Passive ROM Right eval Left eval  Right  12/4  Right  12/13 Right 11/09/23 Right 1/28  Hip flexion         Hip extension         Hip abduction         Hip adduction         Hip internal rotation         Hip external rotation         Knee flexion 38  85 100 115 121 125  Knee extension -6  -2    0  Ankle dorsiflexion         Ankle plantarflexion         Ankle inversion         Ankle eversion          (Blank rows = not tested)  LOWER EXTREMITY MMT:  MMT Right eval Left eval Right 11/11/23 Left 11/11/23  Left   Hip flexion   62.8 70.4    Hip extension        Hip abduction        Hip adduction        Hip  internal rotation        Hip external rotation        Knee flexion        Knee extension   50.5 104    Ankle dorsiflexion        Ankle plantarflexion        Ankle inversion        Ankle eversion         (Blank rows = not tested) not taken today secondary to significant swelling   Left 90 124.3 45 84.6  Right 90 53.9  67% deficit   Left 45  74.4 12 % deficit    GAIT: Reviewed how to weight-bear with 2 crutches.  Decreased weightbearing on right side compared to left.  Decreased right hip flexion.  We also reviewed how to use single crutch.  Patient advised not to progress to single crutch until he feels comfortable confident with 2 crutches. TODAY'S TREATMENT:                                                                                                                              DATE:  2/19 There-ex Bike 5 min for warm up  Leg press 3x12 100 lbs  Knee extension 3x12  30 lbs   Nuro-Re-ed   Cable walk 45 lbs 10x fwd and back  Rebounder 2 x 20 Airex forward and lateral step 2 x 20 KB dead lift 26 pound from 6 inch 3 x 12   Last visit:   There-ex: Bike 5 min for warm up  SL SLR 3 lbs 3x12 Leg press 3x12 100 lbs  Sidelying shuttle 75# 3 x 10 Knee extension 3x12  30 lbs There-Act  Self Care  Nuro-Re-ed  Cable walk 45 lbs 10x fwd and back  Eccentric step down 4 inch 2x15 sec hold  12/19/23 Bike 5 minutes Lateral step down 6 inch 1 x 10 Sidelying hip abduction 2# 4 x 10 Sidelying shuttle 75# 3 x 10 Hip hike off step 2 x 10   1/28 Elliptical 6 min   Leg press 55 lbs 3x12  Knee extension 3x12  30 lbs   Tindq testing  Reviewed results   Deep squat TRX 2x20  Goblet squat 15 lbs 2x15     12/07/23 Elliptical level 4 5 minutes for dynamic warm up Cable Walk 25lbs  x10 retro Lateral step down 8 inch 2 x 10 Airex step on forward and lateral x 20 Discussion of dynamic exercises and performance of them  1/13 Ex bike 6 min L10   SLE 3x12 4 lbs    Cable Walk 25lbs  x10 each direction  TKE 3x12  35 lbs   Knee extension machine 3x12 20 lbs  Airex step on forward and lateral x 20  1/7 Ex bike 6 min L10  LAQ 3x12 4 lbs  SLR 3x12 4 lbs    Cable Walk 25lbs  x10 each direction  TKE 3x12  25 lbs 1st set 35 lbs next 2   Manual: extension stretching     1/3  LF leg press 2x20 55 lbs  LF knee extension 10 lbs 3x10   Goblet squat 3x12 15 lbs TRX squat 3x12   Air-ex step on x20  Lateral step onto airr-ex  x20     Last visit:  Manual: assessed HEP  LAQ 5 lb 3x12  SLR 3x12 4 lbs   Leg press 3x12 90 lbs  Cable Walk 20lbs  TKE 3x12  25 lbs 1st set 35 lbs next 2      11/11/23 Bike level 10, 5 minutes for dynamic warm up Reassessment Lateral step down 4 inch 1 x 10, 6 inch 1 x 10  Mini squat slide out 2 x 5 Single leg hip hinge 2 x 10   11/09/23 Bike level 10, 5 minutes for dynamic warm up Step up 6 inch 2 x 10  Lateral step down 4 inch 3 x 10  Lunge 2 x 10 SLS on airex 3 x 30 second holds Manual: retrograde edema massage, scar mobilizations STS with RLE bias 1 x 10  12/20 Manual passive range of motion into flexion and extension  SLR 3x10,  4 lbs 3x10            LAQ 5 lbs 3x15    TKE with cable 10 lbs 3x15  Cable walk x10 20 lbs   Reviewed and updated HEP   12/17 SLR 3x10, 2.5# SAQ 3x10, 4 #  LAQ 3x10 4 lbs   Step ups 6 inch 2x10  Exercise bike 5 min   Leg press 90 lbs 3x15 using RPE       PATIENT EDUCATION:  Education details: HEP, symptom management, edema management, progression of activities Person educated: Patient Education method: Explanation, Demonstration, Tactile cues, Verbal cues, and Handouts Education comprehension: verbalized understanding, returned demonstration, verbal cues required, tactile cues required, and needs further education  HOME EXERCISE PROGRAM: Access Code: [email or web address] URL: [email or web address] Date: 10/06/2023 Prepared by: Lorayne Bender  ASSESSMENT:  CLINICAL IMPRESSION: Patient continues to tolerate treatment well.  We continue to work on prerunning type exercises.  He worked on stepping on unstable surface today.  We also worked on the Health and safety inspector.  He is having no significant pain.  He did feel like his knee was potentially swallowing somewhat with deadlifts.  Therapy will continue to progress as tolerated.  Eval hold patient is a 38 year old male status post right unicompartmental knee replacement on 10/03/2023.  At this time he presents with expected limitations in knee range of motion, strength, ability to ambulate, and general functional mobility.  He has significant swelling at this time.  His range of motion is limited in flexion.  He would benefit from skilled therapy to return to active lifestyle.  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 OBJECTIVE IMPAIRMENTS: Abnormal gait, decreased activity tolerance, decreased endurance, decreased mobility, difficulty walking, decreased ROM, decreased strength, increased edema, and pain.   ACTIVITY LIMITATIONS: carrying, lifting, bending, standing, squatting, transfers, bed mobility, toileting, dressing, self feeding, and locomotion level  PARTICIPATION LIMITATIONS: meal prep, cleaning, laundry, driving, shopping, occupation, and yard work  PERSONAL FACTORS: None   REHAB POTENTIAL: Excellent  CLINICAL DECISION MAKING: Stable/uncomplicated  EVALUATION COMPLEXITY: Low   GOALS: Goals reviewed with patient? Yes  SHORT TERM GOALS: Target date: 01/10/2024     Patient will increase right knee strength by 5 lbs  Baseline: Goal status: goal adjusted not tested 12/6 11/11/23: likely improved since eval - MET  2.  Patient will demonstrate full knee extension Baseline:  Goal status: improving -2 12/6 11/11/23: 0 to 125 MET   3.  Patient will ambulate 500 feet without crutches without significant antalgic gait Baseline:  Goal status:  working with cane; 11/11/23: MET  4.  Patient will be independent with base exercise program Baseline:  Goal status: Has base exercise program 12/20; MET  5.  Patient will demonstrate a 3 cm reduction in knee edema Goal status: MET  LONG TERM GOALS: Target date:  01/24/2024      Patient will go up and down 8 steps with reciprocal gait pattern Baseline:  Goal status: progressing stairs as tolerated 1/3  2.  Patient will ambulate community distances without pain Baseline:  Goal status: distance imprpving but not met 1/3 3.  Patient will return to jogging when cleared by MD Baseline:  Goal status: progressing 1/3  4.  Patient will be independent with complete exercise program Baseline:  Goal status: INITIAL  PLAN:  PT FREQUENCY: 1-2x/week  PT DURATION: 8 weeks  PLANNED INTERVENTIONS: 97110-Therapeutic exercises, 97530- Therapeutic activity, [id number]- Neuromuscular re-education, 97535- Self Care, [id number]- Manual therapy, [id number]- Gait training, [phone]- Aquatic Therapy, 97014- Electrical stimulation (unattended), 97035- Ultrasound, Patient/Family education, Stair training, Taping, Dry Needling, DME instructions, Cryotherapy, and Moist heat   PLAN FOR NEXT SESSION: Consider vasopneumatic device next visit if he has continued swelling. Continue quad strengthening    Dessie Coma, PT 01/03/2024, 10:41 AM

## 2024-01-05 ENCOUNTER — Ambulatory Visit (HOSPITAL_BASED_OUTPATIENT_CLINIC_OR_DEPARTMENT_OTHER): Payer: 59 | Admitting: Physical Therapy

## 2024-01-05 ENCOUNTER — Encounter (HOSPITAL_BASED_OUTPATIENT_CLINIC_OR_DEPARTMENT_OTHER): Payer: Self-pay | Admitting: Physical Therapy

## 2024-01-05 DIAGNOSIS — R2689 Other abnormalities of gait and mobility: Secondary | ICD-10-CM

## 2024-01-05 DIAGNOSIS — M25561 Pain in right knee: Secondary | ICD-10-CM | POA: Diagnosis not present

## 2024-01-05 DIAGNOSIS — R600 Localized edema: Secondary | ICD-10-CM

## 2024-01-05 DIAGNOSIS — M25661 Stiffness of right knee, not elsewhere classified: Secondary | ICD-10-CM

## 2024-01-05 NOTE — Therapy (Signed)
 OUTPATIENT PHYSICAL THERAPY LOWER EXTREMITY TREATMENT   Patient Name: Jorge Lee MRN: [id number] DOB:05-29-1986, 38 y.o., male Today's Date: 01/05/2024    END OF SESSION:  PT End of Session - 01/05/24 1018     Visit Number 20    Number of Visits 32    Date for PT Re-Evaluation 02/06/24    PT Start Time 1018    PT Stop Time 1058    PT Time Calculation (min) 40 min    Activity Tolerance Patient tolerated treatment well    Behavior During Therapy WFL for tasks assessed/performed                Past Medical History:  Diagnosis Date   Narcolepsy    PONV (postoperative nausea and vomiting)    Past Surgical History:  Procedure Laterality Date   KNEE ARTHROSCOPY     x2   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   Procedure: RIGHT  KNEE UNI ARTHROPLASTY;  Surgeon: Huel Cote, MD;  Location: [facility];  Service: Orthopedics;  Laterality: Right;   Patient Active Problem List   Diagnosis Date Noted   Unilateral primary osteoarthritis, right knee 10/03/2023   Baker's cyst of knee, right 09/09/2020   Osteochondritis dissecans of right knee 01/15/2020   Right foot drop 08/02/2019   Posterior right knee pain 12/05/2017    PCP: Leodis Sias MD  REFERRING PROVIDER: Dr Huel Cote   REFERRING DIAG: right UKA 10/03/2023  THERAPY DIAG:  Acute pain of right knee  Stiffness of right knee, not elsewhere classified  Localized edema  Other abnormalities of gait and mobility  Rationale for Evaluation and Treatment: [facility]   ONSET DATE: 10/03/2023  SUBJECTIVE:  Days since surgery: 94  SUBJECTIVE STATEMENT: Patient reports knee is feeling good but stiff. Continues to walk and workout.   Eval: Patient is a 39 year old male status post right unicompartmental knee replacement.  He has a long history of right knee pain.  He is an avid runner.  His pain and been increasing to the point where he could not run.  At this time he is having  significant pain and swelling particularly at night.  He was using crutches for primary ambulation.  PERTINENT HISTORY: No significant PMH PAIN:  Are you having pain? Yes: NPRS scale: 0/10 right now Pain location: right knee  Pain description: aching  Aggravating factors: night time  Relieving factors: pain meds   PRECAUTIONS: None  RED FLAGS: None   WEIGHT BEARING RESTRICTIONS: Yes WBAT  FALLS:  Has patient fallen in last 6 months? No  LIVING ENVIRONMENT: 10 steps inside   OCCUPATION:  Elementary school physical education teacher  Running:   PLOF: Independent  PATIENT GOALS: To have less pain and to return to running/return to work  NEXT MD VISIT:  Next week  OBJECTIVE:  Note: Objective measures were completed at Evaluation unless otherwise noted.  DIAGNOSTIC FINDINGS: Nothing postop  PATIENT SURVEYS:  FOTO 18% currently 62% expected in 20 visits  11/11/23: 59% function  COGNITION: Overall cognitive status: Within functional limits for tasks assessed     SENSATION: [facility]  EDEMA:  Circumferential: Right 49.4 left 41 cm 11/11/23: 42 cm    POSTURE: No Significant postural limitations  PALPATION:   LOWER EXTREMITY ROM:  Passive ROM Right eval Left eval  Right  12/4 Right  12/13 Right 11/09/23 Right 1/28  Hip flexion         Hip extension  Hip abduction         Hip adduction         Hip internal rotation         Hip external rotation         Knee flexion 38  85 100 115 121 125  Knee extension -6  -2    0  Ankle dorsiflexion         Ankle plantarflexion         Ankle inversion         Ankle eversion          (Blank rows = not tested)  LOWER EXTREMITY MMT:  MMT Right eval Left eval Right 11/11/23 Left 11/11/23  Left   Hip flexion   62.8 70.4    Hip extension        Hip abduction        Hip adduction        Hip internal rotation        Hip external rotation        Knee flexion        Knee extension   50.5 104    Ankle  dorsiflexion        Ankle plantarflexion        Ankle inversion        Ankle eversion         (Blank rows = not tested) not taken today secondary to significant swelling   Left 90 124.3 45 84.6  Right 90 53.9  67% deficit   Left 45  74.4 12 % deficit    GAIT: Reviewed how to weight-bear with 2 crutches.  Decreased weightbearing on right side compared to left.  Decreased right hip flexion.  We also reviewed how to use single crutch.  Patient advised not to progress to single crutch until he feels comfortable confident with 2 crutches. TODAY'S TREATMENT:                                                                                                                              DATE:  01/05/24 Bike 5 min for warm up  Cable single leg hip hinge 3 x 12 Curtsey lunge 2 x 10 Step up and over bosu 2 x 10 Airex forward and lateral step 2 x 20  2/19 There-ex Bike 5 min for warm up  Leg press 3x12 100 lbs  Knee extension 3x12  30 lbs   Nuro-Re-ed   Cable walk 45 lbs 10x fwd and back  Rebounder 2 x 20 Airex forward and lateral step 2 x 20 KB dead lift 26 pound from 6 inch 3 x 12   Last visit:   There-ex: Bike 5 min for warm up  SL SLR 3 lbs 3x12 Leg press 3x12 100 lbs  Sidelying shuttle 75# 3 x 10 Knee extension 3x12  30 lbs There-Act  Self Care  Nuro-Re-ed  Cable walk 45 lbs 10x fwd and  back  Eccentric step down 4 inch 2x15 sec hold    12/19/23 Bike 5 minutes Lateral step down 6 inch 1 x 10 Sidelying hip abduction 2# 4 x 10 Sidelying shuttle 75# 3 x 10 Hip hike off step 2 x 10   1/28 Elliptical 6 min   Leg press 55 lbs 3x12  Knee extension 3x12  30 lbs   Tindq testing  Reviewed results   Deep squat TRX 2x20  Goblet squat 15 lbs 2x15     12/07/23 Elliptical level 4 5 minutes for dynamic warm up Cable Walk 25lbs  x10 retro Lateral step down 8 inch 2 x 10 Airex step on forward and lateral x 20 Discussion of dynamic exercises and performance of  them  1/13 Ex bike 6 min L10   SLE 3x12 4 lbs   Cable Walk 25lbs  x10 each direction  TKE 3x12  35 lbs   Knee extension machine 3x12 20 lbs  Airex step on forward and lateral x 20  1/7 Ex bike 6 min L10  LAQ 3x12 4 lbs  SLR 3x12 4 lbs    Cable Walk 25lbs  x10 each direction  TKE 3x12  25 lbs 1st set 35 lbs next 2   Manual: extension stretching     1/3  LF leg press 2x20 55 lbs  LF knee extension 10 lbs 3x10   Goblet squat 3x12 15 lbs TRX squat 3x12   Air-ex step on x20  Lateral step onto airr-ex  x20     Last visit:  Manual: assessed HEP  LAQ 5 lb 3x12  SLR 3x12 4 lbs   Leg press 3x12 90 lbs  Cable Walk 20lbs  TKE 3x12  25 lbs 1st set 35 lbs next 2      11/11/23 Bike level 10, 5 minutes for dynamic warm up Reassessment Lateral step down 4 inch 1 x 10, 6 inch 1 x 10  Mini squat slide out 2 x 5 Single leg hip hinge 2 x 10   11/09/23 Bike level 10, 5 minutes for dynamic warm up Step up 6 inch 2 x 10  Lateral step down 4 inch 3 x 10  Lunge 2 x 10 SLS on airex 3 x 30 second holds Manual: retrograde edema massage, scar mobilizations STS with RLE bias 1 x 10  12/20 Manual passive range of motion into flexion and extension  SLR 3x10,  4 lbs 3x10            LAQ 5 lbs 3x15    TKE with cable 10 lbs 3x15  Cable walk x10 20 lbs   Reviewed and updated HEP   12/17 SLR 3x10, 2.5# SAQ 3x10, 4 #  LAQ 3x10 4 lbs   Step ups 6 inch 2x10  Exercise bike 5 min   Leg press 90 lbs 3x15 using RPE       PATIENT EDUCATION:  Education details: HEP, symptom management, edema management, progression of activities Person educated: Patient Education method: Explanation, Demonstration, Tactile cues, Verbal cues, and Handouts Education comprehension: verbalized understanding, returned demonstration, verbal cues required, tactile cues required, and needs further education  HOME EXERCISE PROGRAM: Access Code: [email or web address] URL:  [email or web address] Date: 10/06/2023 Prepared by: Lorayne Bender  ASSESSMENT:  CLINICAL IMPRESSION: Continued with RLE strength and motor control exercises which is tolerated well. Moderate fatigue at end of session. Patient will continue to benefit from physical therapy in order to improve function and reduce impairment.   Eval hold patient is  a 39 year old male status post right unicompartmental knee replacement on 10/03/2023.  At this time he presents with expected limitations in knee range of motion, strength, ability to ambulate, and general functional mobility.  He has significant swelling at this time.  His range of motion is limited in flexion.  He would benefit from skilled therapy to return to active lifestyle.  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 OBJECTIVE IMPAIRMENTS: Abnormal gait, decreased activity tolerance, decreased endurance, decreased mobility, difficulty walking, decreased ROM, decreased strength, increased edema, and pain.   ACTIVITY LIMITATIONS: carrying, lifting, bending, standing, squatting, transfers, bed mobility, toileting, dressing, self feeding, and locomotion level  PARTICIPATION LIMITATIONS: meal prep, cleaning, laundry, driving, shopping, occupation, and yard work  PERSONAL FACTORS: None   REHAB POTENTIAL: Excellent  CLINICAL DECISION MAKING: Stable/uncomplicated  EVALUATION COMPLEXITY: Low   GOALS: Goals reviewed with patient? Yes  SHORT TERM GOALS: Target date: 01/10/2024     Patient will increase right knee strength by 5 lbs  Baseline: Goal status: goal adjusted not tested 12/6 11/11/23: likely improved since eval - MET  2.  Patient will demonstrate full knee extension Baseline:  Goal status: improving -2 12/6 11/11/23: 0 to 125 MET   3.  Patient will ambulate 500 feet without crutches without significant antalgic gait Baseline:  Goal status: working with cane; 11/11/23: MET  4.  Patient  will be independent with base exercise program Baseline:  Goal status: Has base exercise program 12/20; MET  5.  Patient will demonstrate a 3 cm reduction in knee edema Goal status: MET  LONG TERM GOALS: Target date:  01/24/2024      Patient will go up and down 8 steps with reciprocal gait pattern Baseline:  Goal status: progressing stairs as tolerated 1/3  2.  Patient will ambulate community distances without pain Baseline:  Goal status: distance imprpving but not met 1/3 3.  Patient will return to jogging when cleared by MD Baseline:  Goal status: progressing 1/3  4.  Patient will be independent with complete exercise program Baseline:  Goal status: INITIAL  PLAN:  PT FREQUENCY: 1-2x/week  PT DURATION: 8 weeks  PLANNED INTERVENTIONS: 97110-Therapeutic exercises, 97530- Therapeutic activity, [id number]- Neuromuscular re-education, 97535- Self Care, 11914- Manual therapy, [id number]- Gait training, [phone]- Aquatic Therapy, 97014- Electrical stimulation (unattended), 97035- Ultrasound, Patient/Family education, Stair training, Taping, Dry Needling, DME instructions, Cryotherapy, and Moist heat   PLAN FOR NEXT SESSION: Consider vasopneumatic device next visit if he has continued swelling. Continue quad strengthening    Wyman Songster, PT 01/05/2024, 11:03 AM

## 2024-01-09 ENCOUNTER — Encounter (HOSPITAL_BASED_OUTPATIENT_CLINIC_OR_DEPARTMENT_OTHER): Payer: Self-pay | Admitting: Physical Therapy

## 2024-01-09 ENCOUNTER — Ambulatory Visit (HOSPITAL_BASED_OUTPATIENT_CLINIC_OR_DEPARTMENT_OTHER): Payer: 59 | Admitting: Physical Therapy

## 2024-01-09 DIAGNOSIS — R600 Localized edema: Secondary | ICD-10-CM

## 2024-01-09 DIAGNOSIS — M25561 Pain in right knee: Secondary | ICD-10-CM | POA: Diagnosis not present

## 2024-01-09 DIAGNOSIS — R2689 Other abnormalities of gait and mobility: Secondary | ICD-10-CM

## 2024-01-09 DIAGNOSIS — M25661 Stiffness of right knee, not elsewhere classified: Secondary | ICD-10-CM

## 2024-01-09 NOTE — Therapy (Signed)
 OUTPATIENT PHYSICAL THERAPY LOWER EXTREMITY TREATMENT   Patient Name: Jorge Lee MRN: [id number] DOB:[birth year], 38 y.o., male Today's Date: 01/09/2024    END OF SESSION:  PT End of Session - 01/09/24 1111     Visit Number 21    Number of Visits 32    Date for PT Re-Evaluation 02/06/24    PT Start Time 1100    PT Stop Time 1143    PT Time Calculation (min) 43 min    Activity Tolerance Patient tolerated treatment well    Behavior During Therapy WFL for tasks assessed/performed                 Past Medical History:  Diagnosis Date   Narcolepsy    PONV (postoperative nausea and vomiting)    Past Surgical History:  Procedure Laterality Date   KNEE ARTHROSCOPY     x2   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   Procedure: RIGHT  KNEE UNI ARTHROPLASTY;  Surgeon: Huel Cote, MD;  Location: [facility];  Service: Orthopedics;  Laterality: Right;   Patient Active Problem List   Diagnosis Date Noted   Unilateral primary osteoarthritis, right knee 10/03/2023   Baker's cyst of knee, right 09/09/2020   Osteochondritis dissecans of right knee 01/15/2020   Right foot drop 08/02/2019   Posterior right knee pain 12/05/2017    PCP: Leodis Sias MD  REFERRING PROVIDER: Dr Huel Cote   REFERRING DIAG: right UKA 10/03/2023  THERAPY DIAG:  No diagnosis found.  Rationale for Evaluation and Treatment: [facility]   ONSET DATE: 10/03/2023  SUBJECTIVE:  Days since surgery: 98  SUBJECTIVE STATEMENT: The patient goes back to work next week. Overall he is doing well. He was fatigued after the last session but not in pain   Eval: Patient is a 38 year old male status post right unicompartmental knee replacement.  He has a long history of right knee pain.  He is an avid runner.  His pain and been increasing to the point where he could not run.  At this time he is having significant pain and swelling particularly at night.  He was using crutches for  primary ambulation.  PERTINENT HISTORY: No significant PMH PAIN:  Are you having pain? Yes: NPRS scale: 0/10 right now Pain location: right knee  Pain description: aching  Aggravating factors: night time  Relieving factors: pain meds   PRECAUTIONS: None  RED FLAGS: None   WEIGHT BEARING RESTRICTIONS: Yes WBAT  FALLS:  Has patient fallen in last 6 months? No  LIVING ENVIRONMENT: 10 steps inside   OCCUPATION:  Elementary school physical education teacher  Running:   PLOF: Independent  PATIENT GOALS: To have less pain and to return to running/return to work  NEXT MD VISIT:  Next week  OBJECTIVE:  Note: Objective measures were completed at Evaluation unless otherwise noted.  DIAGNOSTIC FINDINGS: Nothing postop  PATIENT SURVEYS:  FOTO 18% currently 62% expected in 20 visits  11/11/23: 59% function  COGNITION: Overall cognitive status: Within functional limits for tasks assessed     SENSATION: [facility]  EDEMA:  Circumferential: Right 49.4 left 41 cm 11/11/23: 42 cm    POSTURE: No Significant postural limitations  PALPATION:   LOWER EXTREMITY ROM:  Passive ROM Right eval Left eval  Right  12/4 Right  12/13 Right 11/09/23 Right 1/28  Hip flexion         Hip extension         Hip abduction  Hip adduction         Hip internal rotation         Hip external rotation         Knee flexion 38  85 100 115 121 125  Knee extension -6  -2    0  Ankle dorsiflexion         Ankle plantarflexion         Ankle inversion         Ankle eversion          (Blank rows = not tested)  LOWER EXTREMITY MMT:  MMT Right eval Left eval Right 11/11/23 Left 11/11/23  Left   Hip flexion   62.8 70.4    Hip extension        Hip abduction        Hip adduction        Hip internal rotation        Hip external rotation        Knee flexion        Knee extension   50.5 104    Ankle dorsiflexion        Ankle plantarflexion        Ankle inversion        Ankle  eversion         (Blank rows = not tested) not taken today secondary to significant swelling   Left 90 124.3 45 84.6  Right 90 53.9  67% deficit   Left 45  74.4 12 % deficit    GAIT: Reviewed how to weight-bear with 2 crutches.  Decreased weightbearing on right side compared to left.  Decreased right hip flexion.  We also reviewed how to use single crutch.  Patient advised not to progress to single crutch until he feels comfortable confident with 2 crutches. TODAY'S TREATMENT:                                                                                                                              DATE:  There-ex Bike 5 min for warm up  Leg press 3x12 140 lbs  Knee extension 3x12  30 lbs   Nuro-Re-ed   Cable walk 45 lbs 10x fwd and back  Airex forward and lateral step 2 x 20 Pall[place] Press 2x15 27 lbs        01/05/24 Bike 5 min for warm up  Cable single leg hip hinge 3 x 12 Curtsey lunge 2 x 10 Step up and over bosu 2 x 10 Airex forward and lateral step 2 x 20  2/19 There-ex Bike 5 min for warm up  Leg press 3x12 100 lbs  Knee extension 3x12  30 lbs   Nuro-Re-ed   Cable walk 45 lbs 10x fwd and back  Rebounder 2 x 20 Airex forward and lateral step 2 x 20 KB dead lift 26 pound from 6 inch 3 x 12   Last visit:   There-ex:  Bike 5 min for warm up  SL SLR 3 lbs 3x12 Leg press 3x12 100 lbs  Sidelying shuttle 75# 3 x 10 Knee extension 3x12  30 lbs There-Act  Self Care  Nuro-Re-ed  Cable walk 45 lbs 10x fwd and back  Eccentric step down 4 inch 2x15 sec hold    12/19/23 Bike 5 minutes Lateral step down 6 inch 1 x 10 Sidelying hip abduction 2# 4 x 10 Sidelying shuttle 75# 3 x 10 Hip hike off step 2 x 10   1/28 Elliptical 6 min   Leg press 55 lbs 3x12  Knee extension 3x12  30 lbs   Tindq testing  Reviewed results   Deep squat TRX 2x20  Goblet squat 15 lbs 2x15     12/07/23 Elliptical level 4 5 minutes for dynamic warm up Cable Walk 25lbs   x10 retro Lateral step down 8 inch 2 x 10 Airex step on forward and lateral x 20 Discussion of dynamic exercises and performance of them  1/13 Ex bike 6 min L10   SLE 3x12 4 lbs   Cable Walk 25lbs  x10 each direction  TKE 3x12  35 lbs   Knee extension machine 3x12 20 lbs  Airex step on forward and lateral x 20  1/7 Ex bike 6 min L10  LAQ 3x12 4 lbs  SLR 3x12 4 lbs    Cable Walk 25lbs  x10 each direction  TKE 3x12  25 lbs 1st set 35 lbs next 2   Manual: extension stretching     1/3  LF leg press 2x20 55 lbs  LF knee extension 10 lbs 3x10   Goblet squat 3x12 15 lbs TRX squat 3x12   Air-ex step on x20  Lateral step onto airr-ex  x20     Last visit:  Manual: assessed HEP  LAQ 5 lb 3x12  SLR 3x12 4 lbs   Leg press 3x12 90 lbs  Cable Walk 20lbs  TKE 3x12  25 lbs 1st set 35 lbs next 2      11/11/23 Bike level 10, 5 minutes for dynamic warm up Reassessment Lateral step down 4 inch 1 x 10, 6 inch 1 x 10  Mini squat slide out 2 x 5 Single leg hip hinge 2 x 10   11/09/23 Bike level 10, 5 minutes for dynamic warm up Step up 6 inch 2 x 10  Lateral step down 4 inch 3 x 10  Lunge 2 x 10 SLS on airex 3 x 30 second holds Manual: retrograde edema massage, scar mobilizations STS with RLE bias 1 x 10  12/20 Manual passive range of motion into flexion and extension  SLR 3x10,  4 lbs 3x10            LAQ 5 lbs 3x15    TKE with cable 10 lbs 3x15  Cable walk x10 20 lbs   Reviewed and updated HEP   12/17 SLR 3x10, 2.5# SAQ 3x10, 4 #  LAQ 3x10 4 lbs   Step ups 6 inch 2x10  Exercise bike 5 min   Leg press 90 lbs 3x15 using RPE       PATIENT EDUCATION:  Education details: HEP, symptom management, edema management, progression of activities Person educated: Patient Education method: Explanation, Demonstration, Tactile cues, Verbal cues, and Handouts Education comprehension: verbalized understanding, returned demonstration, verbal cues required,  tactile cues required, and needs further education  HOME EXERCISE PROGRAM: Access Code: [email or web address] URL: [email or web address] Date: 10/06/2023 Prepared by: Lorayne Bender  ASSESSMENT:  CLINICAL IMPRESSION: Therapy continues to work on general strengthening and sport specific rehabilitation. He tolerated well. He will go back to work next week. We will re-assess program at that time and adjust it to work with his work activity.   Eval hold patient is a 38 year old male status post right unicompartmental knee replacement on 10/03/2023.  At this time he presents with expected limitations in knee range of motion, strength, ability to ambulate, and general functional mobility.  He has significant swelling at this time.  His range of motion is limited in flexion.  He would benefit from skilled therapy to return to active lifestyle.  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 OBJECTIVE IMPAIRMENTS: Abnormal gait, decreased activity tolerance, decreased endurance, decreased mobility, difficulty walking, decreased ROM, decreased strength, increased edema, and pain.   ACTIVITY LIMITATIONS: carrying, lifting, bending, standing, squatting, transfers, bed mobility, toileting, dressing, self feeding, and locomotion level  PARTICIPATION LIMITATIONS: meal prep, cleaning, laundry, driving, shopping, occupation, and yard work  PERSONAL FACTORS: None   REHAB POTENTIAL: Excellent  CLINICAL DECISION MAKING: Stable/uncomplicated  EVALUATION COMPLEXITY: Low   GOALS: Goals reviewed with patient? Yes  SHORT TERM GOALS: Target date: 01/10/2024     Patient will increase right knee strength by 5 lbs  Baseline: Goal status: goal adjusted not tested 12/6 11/11/23: likely improved since eval - MET  2.  Patient will demonstrate full knee extension Baseline:  Goal status: improving -2 12/6 11/11/23: 0 to 125 MET   3.  Patient will ambulate 500 feet without crutches  without significant antalgic gait Baseline:  Goal status: working with cane; 11/11/23: MET  4.  Patient will be independent with base exercise program Baseline:  Goal status: Has base exercise program 12/20; MET  5.  Patient will demonstrate a 3 cm reduction in knee edema Goal status: MET  LONG TERM GOALS: Target date:  01/24/2024      Patient will go up and down 8 steps with reciprocal gait pattern Baseline:  Goal status: progressing stairs as tolerated 1/3  2.  Patient will ambulate community distances without pain Baseline:  Goal status: distance imprpving but not met 1/3 3.  Patient will return to jogging when cleared by MD Baseline:  Goal status: progressing 1/3  4.  Patient will be independent with complete exercise program Baseline:  Goal status: INITIAL  PLAN:  PT FREQUENCY: 1-2x/week  PT DURATION: 8 weeks  PLANNED INTERVENTIONS: 97110-Therapeutic exercises, 97530- Therapeutic activity, [id number]- Neuromuscular re-education, 97535- Self Care, 16109- Manual therapy, [id number]- Gait training, [phone]- Aquatic Therapy, 97014- Electrical stimulation (unattended), 97035- Ultrasound, Patient/Family education, Stair training, Taping, Dry Needling, DME instructions, Cryotherapy, and Moist heat   PLAN FOR NEXT SESSION: Consider vasopneumatic device next visit if he has continued swelling. Continue quad strengthening    Dessie Coma, PT 01/09/2024, 3:11 PM

## 2024-01-11 ENCOUNTER — Encounter (HOSPITAL_BASED_OUTPATIENT_CLINIC_OR_DEPARTMENT_OTHER): Payer: Self-pay | Admitting: Physical Therapy

## 2024-01-11 ENCOUNTER — Ambulatory Visit (HOSPITAL_BASED_OUTPATIENT_CLINIC_OR_DEPARTMENT_OTHER): Payer: 59 | Admitting: Physical Therapy

## 2024-01-11 DIAGNOSIS — M25561 Pain in right knee: Secondary | ICD-10-CM | POA: Diagnosis not present

## 2024-01-11 DIAGNOSIS — R600 Localized edema: Secondary | ICD-10-CM

## 2024-01-11 DIAGNOSIS — R2689 Other abnormalities of gait and mobility: Secondary | ICD-10-CM

## 2024-01-11 DIAGNOSIS — M25661 Stiffness of right knee, not elsewhere classified: Secondary | ICD-10-CM

## 2024-01-11 NOTE — Therapy (Signed)
 OUTPATIENT PHYSICAL THERAPY LOWER EXTREMITY TREATMENT   Patient Name: Jorge Lee MRN: [id number] DOB:01-Sep-1986, 38 y.o., male Today's Date: 01/11/2024    END OF SESSION:  PT End of Session - 01/11/24 1426     Visit Number 22    Number of Visits 32    Date for PT Re-Evaluation 02/06/24    PT Start Time 1426    PT Stop Time 1512    PT Time Calculation (min) 46 min    Activity Tolerance Patient tolerated treatment well    Behavior During Therapy WFL for tasks assessed/performed                 Past Medical History:  Diagnosis Date   Narcolepsy    PONV (postoperative nausea and vomiting)    Past Surgical History:  Procedure Laterality Date   KNEE ARTHROSCOPY     x2   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   Procedure: RIGHT  KNEE UNI ARTHROPLASTY;  Surgeon: Huel Cote, MD;  Location: [facility];  Service: Orthopedics;  Laterality: Right;   Patient Active Problem List   Diagnosis Date Noted   Unilateral primary osteoarthritis, right knee 10/03/2023   Baker's cyst of knee, right 09/09/2020   Osteochondritis dissecans of right knee 01/15/2020   Right foot drop 08/02/2019   Posterior right knee pain 12/05/2017    PCP: Leodis Sias MD  REFERRING PROVIDER: Dr Huel Cote   REFERRING DIAG: right UKA 10/03/2023  THERAPY DIAG:  Acute pain of right knee  Stiffness of right knee, not elsewhere classified  Localized edema  Other abnormalities of gait and mobility  Rationale for Evaluation and Treatment: [facility]   ONSET DATE: 10/03/2023  SUBJECTIVE:  Days since surgery: 100  SUBJECTIVE STATEMENT: Patient states knee is doing good. Some continued swelling and soreness for a day or 2 after exercise.   Eval: Patient is a 38 year old male status post right unicompartmental knee replacement.  He has a long history of right knee pain.  He is an avid runner.  His pain and been increasing to the point where he could not run.  At this  time he is having significant pain and swelling particularly at night.  He was using crutches for primary ambulation.  PERTINENT HISTORY: No significant PMH PAIN:  Are you having pain? Yes: NPRS scale: 0/10 right now Pain location: right knee  Pain description: aching  Aggravating factors: night time  Relieving factors: pain meds   PRECAUTIONS: None  RED FLAGS: None   WEIGHT BEARING RESTRICTIONS: Yes WBAT  FALLS:  Has patient fallen in last 6 months? No  LIVING ENVIRONMENT: 10 steps inside   OCCUPATION:  Elementary school physical education teacher  Running:   PLOF: Independent  PATIENT GOALS: To have less pain and to return to running/return to work  NEXT MD VISIT:  Next week  OBJECTIVE:  Note: Objective measures were completed at Evaluation unless otherwise noted.  DIAGNOSTIC FINDINGS: Nothing postop  PATIENT SURVEYS:  FOTO 18% currently 62% expected in 20 visits  11/11/23: 59% function  COGNITION: Overall cognitive status: Within functional limits for tasks assessed     SENSATION: [facility]  EDEMA:  Circumferential: Right 49.4 left 41 cm 11/11/23: 42 cm    POSTURE: No Significant postural limitations  PALPATION:   LOWER EXTREMITY ROM:  Passive ROM Right eval Left eval  Right  12/4 Right  12/13 Right 11/09/23 Right 1/28  Hip flexion         Hip extension  Hip abduction         Hip adduction         Hip internal rotation         Hip external rotation         Knee flexion 38  85 100 115 121 125  Knee extension -6  -2    0  Ankle dorsiflexion         Ankle plantarflexion         Ankle inversion         Ankle eversion          (Blank rows = not tested)  LOWER EXTREMITY MMT:  MMT Right eval Left eval Right 11/11/23 Left 11/11/23  Left   Hip flexion   62.8 70.4    Hip extension        Hip abduction        Hip adduction        Hip internal rotation        Hip external rotation        Knee flexion        Knee extension    50.5 104    Ankle dorsiflexion        Ankle plantarflexion        Ankle inversion        Ankle eversion         (Blank rows = not tested) not taken today secondary to significant swelling   Left 90 124.3 45 84.6  Right 90 53.9  67% deficit   Left 45  74.4 12 % deficit    GAIT: Reviewed how to weight-bear with 2 crutches.  Decreased weightbearing on right side compared to left.  Decreased right hip flexion.  We also reviewed how to use single crutch.  Patient advised not to progress to single crutch until he feels comfortable confident with 2 crutches. TODAY'S TREATMENT:                                                                                                                              DATE:  01/11/24 Elliptical 5 minutes for warm up Cable single leg hip hinge 15# 3 x 12 Curtsey lunge 2 x 10, 5# DB 1 x 10 with D2 lift Split squat 2 x 10 Step up and over bosu 3 x 10 Airex forward and lateral step hops 3 x 20   10/08/24 There-ex Bike 5 min for warm up  Leg press 3x12 140 lbs  Knee extension 3x12  30 lbs   Nuro-Re-ed   Cable walk 45 lbs 10x fwd and back  Airex forward and lateral step 2 x 20 Pallof Press 2x15 27 lbs    01/05/24 Bike 5 min for warm up  Cable single leg hip hinge 3 x 12 Curtsey lunge 2 x 10 Step up and over bosu 2 x 10 Airex forward and lateral step 2 x 20  2/19 There-ex Bike 5 min  for warm up  Leg press 3x12 100 lbs  Knee extension 3x12  30 lbs   Nuro-Re-ed   Cable walk 45 lbs 10x fwd and back  Rebounder 2 x 20 Airex forward and lateral step 2 x 20 KB dead lift 26 pound from 6 inch 3 x 12   Last visit:   There-ex: Bike 5 min for warm up  SL SLR 3 lbs 3x12 Leg press 3x12 100 lbs  Sidelying shuttle 75# 3 x 10 Knee extension 3x12  30 lbs There-Act  Self Care  Nuro-Re-ed  Cable walk 45 lbs 10x fwd and back  Eccentric step down 4 inch 2x15 sec hold    12/19/23 Bike 5 minutes Lateral step down 6 inch 1 x 10 Sidelying hip  abduction 2# 4 x 10 Sidelying shuttle 75# 3 x 10 Hip hike off step 2 x 10   1/28 Elliptical 6 min   Leg press 55 lbs 3x12  Knee extension 3x12  30 lbs   Tindq testing  Reviewed results   Deep squat TRX 2x20  Goblet squat 15 lbs 2x15     12/07/23 Elliptical level 4 5 minutes for dynamic warm up Cable Walk 25lbs  x10 retro Lateral step down 8 inch 2 x 10 Airex step on forward and lateral x 20 Discussion of dynamic exercises and performance of them  1/13 Ex bike 6 min L10   SLE 3x12 4 lbs   Cable Walk 25lbs  x10 each direction  TKE 3x12  35 lbs   Knee extension machine 3x12 20 lbs  Airex step on forward and lateral x 20  1/7 Ex bike 6 min L10  LAQ 3x12 4 lbs  SLR 3x12 4 lbs    Cable Walk 25lbs  x10 each direction  TKE 3x12  25 lbs 1st set 35 lbs next 2   Manual: extension stretching     1/3  LF leg press 2x20 55 lbs  LF knee extension 10 lbs 3x10   Goblet squat 3x12 15 lbs TRX squat 3x12   Air-ex step on x20  Lateral step onto airr-ex  x20     Last visit:  Manual: assessed HEP  LAQ 5 lb 3x12  SLR 3x12 4 lbs   Leg press 3x12 90 lbs  Cable Walk 20lbs  TKE 3x12  25 lbs 1st set 35 lbs next 2      11/11/23 Bike level 10, 5 minutes for dynamic warm up Reassessment Lateral step down 4 inch 1 x 10, 6 inch 1 x 10  Mini squat slide out 2 x 5 Single leg hip hinge 2 x 10   11/09/23 Bike level 10, 5 minutes for dynamic warm up Step up 6 inch 2 x 10  Lateral step down 4 inch 3 x 10  Lunge 2 x 10 SLS on airex 3 x 30 second holds Manual: retrograde edema massage, scar mobilizations STS with RLE bias 1 x 10  12/20 Manual passive range of motion into flexion and extension  SLR 3x10,  4 lbs 3x10            LAQ 5 lbs 3x15    TKE with cable 10 lbs 3x15  Cable walk x10 20 lbs   Reviewed and updated HEP   12/17 SLR 3x10, 2.5# SAQ 3x10, 4 #  LAQ 3x10 4 lbs   Step ups 6 inch 2x10  Exercise bike 5 min   Leg press 90 lbs 3x15 using RPE  PATIENT EDUCATION:  Education details: HEP, symptom management, edema management, progression of activities Person educated: Patient Education method: Explanation, Demonstration, Tactile cues, Verbal cues, and Handouts Education comprehension: verbalized understanding, returned demonstration, verbal cues required, tactile cues required, and needs further education  HOME EXERCISE PROGRAM: Access Code: [email or web address] URL: [email or web address] Date: 10/06/2023 Prepared by: Lorayne Bender  ASSESSMENT:  CLINICAL IMPRESSION: Patient continues to progress well with higher level balance and strengthening. Light pre running/plyos with airex tolerated well. Overall doing great. Patient will continue to benefit from physical therapy in order to improve function and reduce impairment.   Eval hold patient is a 38 year old male status post right unicompartmental knee replacement on 10/03/2023.  At this time he presents with expected limitations in knee range of motion, strength, ability to ambulate, and general functional mobility.  He has significant swelling at this time.  His range of motion is limited in flexion.  He would benefit from skilled therapy to return to active lifestyle.  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 OBJECTIVE IMPAIRMENTS: Abnormal gait, decreased activity tolerance, decreased endurance, decreased mobility, difficulty walking, decreased ROM, decreased strength, increased edema, and pain.   ACTIVITY LIMITATIONS: carrying, lifting, bending, standing, squatting, transfers, bed mobility, toileting, dressing, self feeding, and locomotion level  PARTICIPATION LIMITATIONS: meal prep, cleaning, laundry, driving, shopping, occupation, and yard work  PERSONAL FACTORS: None   REHAB POTENTIAL: Excellent  CLINICAL DECISION MAKING: Stable/uncomplicated  EVALUATION COMPLEXITY: Low   GOALS: Goals reviewed with patient? Yes  SHORT TERM  GOALS: Target date: 01/10/2024     Patient will increase right knee strength by 5 lbs  Baseline: Goal status: goal adjusted not tested 12/6 11/11/23: likely improved since eval - MET  2.  Patient will demonstrate full knee extension Baseline:  Goal status: improving -2 12/6 11/11/23: 0 to 125 MET   3.  Patient will ambulate 500 feet without crutches without significant antalgic gait Baseline:  Goal status: working with cane; 11/11/23: MET  4.  Patient will be independent with base exercise program Baseline:  Goal status: Has base exercise program 12/20; MET  5.  Patient will demonstrate a 3 cm reduction in knee edema Goal status: MET  LONG TERM GOALS: Target date:  01/24/2024      Patient will go up and down 8 steps with reciprocal gait pattern Baseline:  Goal status: progressing stairs as tolerated 1/3  2.  Patient will ambulate community distances without pain Baseline:  Goal status: distance imprpving but not met 1/3 3.  Patient will return to jogging when cleared by MD Baseline:  Goal status: progressing 1/3  4.  Patient will be independent with complete exercise program Baseline:  Goal status: INITIAL  PLAN:  PT FREQUENCY: 1-2x/week  PT DURATION: 8 weeks  PLANNED INTERVENTIONS: 97110-Therapeutic exercises, 97530- Therapeutic activity, [id number]- Neuromuscular re-education, 97535- Self Care, [id number]- Manual therapy, [id number]- Gait training, [id number]- Aquatic Therapy, [id number]- Electrical stimulation (unattended), 97035- Ultrasound, Patient/Family education, Stair training, Taping, Dry Needling, DME instructions, Cryotherapy, and Moist heat   PLAN FOR NEXT SESSION: Consider vasopneumatic device next visit if he has continued swelling. Continue quad strengthening    Wyman Songster, PT 01/11/2024, 3:18 PM

## 2024-01-15 ENCOUNTER — Ambulatory Visit (HOSPITAL_BASED_OUTPATIENT_CLINIC_OR_DEPARTMENT_OTHER): Payer: 59 | Admitting: Orthopaedic Surgery

## 2024-01-15 ENCOUNTER — Encounter (HOSPITAL_BASED_OUTPATIENT_CLINIC_OR_DEPARTMENT_OTHER): Payer: Self-pay | Admitting: Orthopaedic Surgery

## 2024-01-15 DIAGNOSIS — M1711 Unilateral primary osteoarthritis, right knee: Secondary | ICD-10-CM | POA: Diagnosis not present

## 2024-01-15 NOTE — Progress Notes (Signed)
 Post Operative Evaluation    Procedure/Date of Surgery: Right knee unicompartmental knee arthroplasty 11/19  Interval History:   Presents today status post right knee medial unicompartmental knee arthroplasty on the above date.  Overall he is doing well.  Denies any pain.  He does occasionally have swelling although this is mild.  He is hopeful to get back to running at this point.  PMH/PSH/Family History/Social History/Meds/Allergies:    Past Medical History:  Diagnosis Date   Narcolepsy    PONV (postoperative nausea and vomiting)    Past Surgical History:  Procedure Laterality Date   KNEE ARTHROSCOPY     x2   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   Procedure: RIGHT  KNEE UNI ARTHROPLASTY;  Surgeon: Huel Cote, MD;  Location: [facility];  Service: Orthopedics;  Laterality: Right;   Social History   Socioeconomic History   Marital status: Married    Spouse name: Not on file   Number of children: Not on file   Years of education: Not on file   Highest education level: Not on file  Occupational History   Not on file  Tobacco Use   Smoking status: Never   Smokeless tobacco: Never  Vaping Use   Vaping status: Never Used  Substance and Sexual Activity   Alcohol use: Yes    Comment: occas   Drug use: No   Sexual activity: Not on file  Other Topics Concern   Not on file  Social History Narrative   Not on file   Social Drivers of Health   Financial Resource Strain: Not on file  Food Insecurity: Not on file  Transportation Needs: Not on file  Physical Activity: Not on file  Stress: Not on file  Social Connections: Not on file   No family history on file. No Known Allergies Current Outpatient Medications  Medication Sig Dispense Refill   terbinafine (LAMISIL) 250 MG tablet Take 1 tablet (250 mg total) by mouth daily. 90 tablet 0   amphetamine-dextroamphetamine (ADDERALL) 20 MG tablet Take 10 mg by mouth 2  (two) times daily.  0   aspirin EC 325 MG tablet Take 1 tablet (325 mg total) by mouth daily. 30 tablet 0   ibuprofen (ADVIL) 800 MG tablet Take 1 tablet (800 mg total) by mouth every 8 (eight) hours as needed. 30 tablet 0   Multiple Vitamins-Minerals (MULTIVITAMIN WITH MINERALS) tablet Take 1 tablet by mouth daily.     oxyCODONE (ROXICODONE) 5 MG immediate release tablet Take 1 tablet (5 mg total) by mouth every 4 (four) hours as needed for severe pain (pain score 7-10) or breakthrough pain. 20 tablet 0   No current facility-administered medications for this visit.   No results found.  Review of Systems:   A ROS was performed including pertinent positives and negatives as documented in the HPI.   Musculoskeletal Exam:    There were no vitals taken for this visit.  Right knee incision is well-appearing without erythema or drainage.  Range of motion is 0 to 120 degrees.  This is no pain.  Mild quad atrophy.  Distal neurosensory exam is intact  Imaging:    4 views right knee: Status post unicompartmental medial knee arthroplasty without complication  I personally reviewed and interpreted the radiographs.   Assessment:   Status post  right knee medial unicompartmental knee arthroplasty.  Overall he is doing very well.  At this time I do believe he is ready for return to jog/run progression.  I did discuss the possibility of an Alter G treadmill.  Overall I have asked that he be smart about his repetitions on the partial knee replacement.  I will plan to see him back in 3 months for reassessment  Plan :    -Return to clinic 12 weeks for reassessment      I personally saw and evaluated the patient, and participated in the management and treatment plan.  Huel Cote, MD Attending Physician, Orthopedic Surgery  This document was dictated using Dragon voice recognition software. A reasonable attempt at proof reading has been made to minimize errors.

## 2024-01-16 ENCOUNTER — Encounter (HOSPITAL_BASED_OUTPATIENT_CLINIC_OR_DEPARTMENT_OTHER): Payer: Self-pay | Admitting: Physical Therapy

## 2024-01-16 ENCOUNTER — Ambulatory Visit (HOSPITAL_BASED_OUTPATIENT_CLINIC_OR_DEPARTMENT_OTHER): Payer: 59 | Attending: Orthopaedic Surgery | Admitting: Physical Therapy

## 2024-01-16 DIAGNOSIS — R2689 Other abnormalities of gait and mobility: Secondary | ICD-10-CM | POA: Diagnosis present

## 2024-01-16 DIAGNOSIS — M25661 Stiffness of right knee, not elsewhere classified: Secondary | ICD-10-CM | POA: Diagnosis present

## 2024-01-16 DIAGNOSIS — M25561 Pain in right knee: Secondary | ICD-10-CM | POA: Diagnosis present

## 2024-01-16 DIAGNOSIS — R600 Localized edema: Secondary | ICD-10-CM | POA: Diagnosis present

## 2024-01-16 NOTE — Therapy (Unsigned)
 OUTPATIENT PHYSICAL THERAPY LOWER EXTREMITY TREATMENT   Patient Name: Jorge Lee MRN: [id number] DOB:1986/07/02, 38 y.o., male Today's Date: 01/17/2024    END OF SESSION:  PT End of Session - 01/16/24 1434     Visit Number 23    Number of Visits 32    Date for PT Re-Evaluation 02/06/24    PT Start Time 1430    PT Stop Time 1512    PT Time Calculation (min) 42 min    Activity Tolerance Patient tolerated treatment well    Behavior During Therapy WFL for tasks assessed/performed                 Past Medical History:  Diagnosis Date   Narcolepsy    PONV (postoperative nausea and vomiting)    Past Surgical History:  Procedure Laterality Date   KNEE ARTHROSCOPY     x2   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   Procedure: RIGHT  KNEE UNI ARTHROPLASTY;  Surgeon: Huel Cote, MD;  Location: [facility];  Service: Orthopedics;  Laterality: Right;   Patient Active Problem List   Diagnosis Date Noted   Unilateral primary osteoarthritis, right knee 10/03/2023   Baker's cyst of knee, right 09/09/2020   Osteochondritis dissecans of right knee 01/15/2020   Right foot drop 08/02/2019   Posterior right knee pain 12/05/2017    PCP: Leodis Sias MD  REFERRING PROVIDER: Dr Huel Cote   REFERRING DIAG: right UKA 10/03/2023  THERAPY DIAG:  Acute pain of right knee  Stiffness of right knee, not elsewhere classified  Localized edema  Other abnormalities of gait and mobility  Rationale for Evaluation and Treatment: [facility]   ONSET DATE: 10/03/2023  SUBJECTIVE:  Days since surgery: 105  SUBJECTIVE STATEMENT: The patient has been to the MD. He has been cleared to run. He goes back to work on Thursday.  Eval: Patient is a 38 year old male status post right unicompartmental knee replacement.  He has a long history of right knee pain.  He is an avid runner.  His pain and been increasing to the point where he could not run.  At this time he is  having significant pain and swelling particularly at night.  He was using crutches for primary ambulation.  PERTINENT HISTORY: No significant PMH PAIN:  Are you having pain? Yes: NPRS scale: 0/10 right now Pain location: right knee  Pain description: aching  Aggravating factors: night time  Relieving factors: pain meds   PRECAUTIONS: None  RED FLAGS: None   WEIGHT BEARING RESTRICTIONS: Yes WBAT  FALLS:  Has patient fallen in last 6 months? No  LIVING ENVIRONMENT: 10 steps inside   OCCUPATION:  Elementary school physical education teacher  Running:   PLOF: Independent  PATIENT GOALS: To have less pain and to return to running/return to work  NEXT MD VISIT:  Next week  OBJECTIVE:  Note: Objective measures were completed at Evaluation unless otherwise noted.  DIAGNOSTIC FINDINGS: Nothing postop  PATIENT SURVEYS:  FOTO 18% currently 62% expected in 20 visits  11/11/23: 59% function  COGNITION: Overall cognitive status: Within functional limits for tasks assessed     SENSATION: [facility]  EDEMA:  Circumferential: Right 49.4 left 41 cm 11/11/23: 42 cm    POSTURE: No Significant postural limitations  PALPATION:   LOWER EXTREMITY ROM:  Passive ROM Right eval Left eval  Right  12/4 Right  12/13 Right 11/09/23 Right 1/28  Hip flexion         Hip extension  Hip abduction         Hip adduction         Hip internal rotation         Hip external rotation         Knee flexion 38  85 100 115 121 125  Knee extension -6  -2    0  Ankle dorsiflexion         Ankle plantarflexion         Ankle inversion         Ankle eversion          (Blank rows = not tested)  LOWER EXTREMITY MMT:  MMT Right eval Left eval Right 11/11/23 Left 11/11/23  Left   Hip flexion   62.8 70.4    Hip extension        Hip abduction        Hip adduction        Hip internal rotation        Hip external rotation        Knee flexion        Knee extension   50.5 104     Ankle dorsiflexion        Ankle plantarflexion        Ankle inversion        Ankle eversion         (Blank rows = not tested) not taken today secondary to significant swelling   Left 90 124.3 45 84.6  Right 90 53.9  67% deficit   Left 45  74.4 12 % deficit    3/4  90 degrees L 119.2  R 86.7  27   45 L 129 Right 104.9   GAIT: Reviewed how to weight-bear with 2 crutches.  Decreased weightbearing on right side compared to left.  Decreased right hip flexion.  We also reviewed how to use single crutch.  Patient advised not to progress to single crutch until he feels comfortable confident with 2 crutches. TODAY'S TREATMENT:                                                                                                                              DATE:  3/4 There-ex Bike 5 min for warm up  Leg press 3x12 140 lbs  Knee extension 3x12  30 lbs   Testing with TNDQ    Neuro-re-ed Airex forward and lateral step 2 x 20 Eccentric step down 2x15 40 lbs   01/11/24 Elliptical 5 minutes for warm up Cable single leg hip hinge 15# 3 x 12 Curtsey lunge 2 x 10, 5# DB 1 x 10 with D2 lift Split squat 2 x 10 Step up and over bosu 3 x 10 Airex forward and lateral step hops 3 x 20   10/08/24 There-ex Bike 5 min for warm up  Leg press 3x12 140 lbs  Knee extension 3x12  30 lbs   Nuro-Re-ed   Cable walk  45 lbs 10x fwd and back  Airex forward and lateral step 2 x 20 Pallof Press 2x15 27 lbs    01/05/24 Bike 5 min for warm up  Cable single leg hip hinge 3 x 12 Curtsey lunge 2 x 10 Step up and over bosu 2 x 10 Airex forward and lateral step 2 x 20  2/19 There-ex Bike 5 min for warm up  Leg press 3x12 100 lbs  Knee extension 3x12  30 lbs   Nuro-Re-ed   Cable walk 45 lbs 10x fwd and back  Rebounder 2 x 20 Airex forward and lateral step 2 x 20 KB dead lift 26 pound from 6 inch 3 x 12   Last visit:   There-ex: Bike 5 min for warm up  SL SLR 3 lbs 3x12 Leg  press 3x12 100 lbs  Sidelying shuttle 75# 3 x 10 Knee extension 3x12  30 lbs There-Act  Self Care  Nuro-Re-ed  Cable walk 45 lbs 10x fwd and back  Eccentric step down 4 inch 2x15 sec hold    12/19/23 Bike 5 minutes Lateral step down 6 inch 1 x 10 Sidelying hip abduction 2# 4 x 10 Sidelying shuttle 75# 3 x 10 Hip hike off step 2 x 10   1/28 Elliptical 6 min   Leg press 55 lbs 3x12  Knee extension 3x12  30 lbs   Tindq testing  Reviewed results   Deep squat TRX 2x20  Goblet squat 15 lbs 2x15     12/07/23 Elliptical level 4 5 minutes for dynamic warm up Cable Walk 25lbs  x10 retro Lateral step down 8 inch 2 x 10 Airex step on forward and lateral x 20 Discussion of dynamic exercises and performance of them  1/13 Ex bike 6 min L10   SLE 3x12 4 lbs   Cable Walk 25lbs  x10 each direction  TKE 3x12  35 lbs   Knee extension machine 3x12 20 lbs  Airex step on forward and lateral x 20  1/7 Ex bike 6 min L10  LAQ 3x12 4 lbs  SLR 3x12 4 lbs    Cable Walk 25lbs  x10 each direction  TKE 3x12  25 lbs 1st set 35 lbs next 2   Manual: extension stretching     1/3  LF leg press 2x20 55 lbs  LF knee extension 10 lbs 3x10   Goblet squat 3x12 15 lbs TRX squat 3x12   Air-ex step on x20  Lateral step onto airr-ex  x20     Last visit:  Manual: assessed HEP  LAQ 5 lb 3x12  SLR 3x12 4 lbs   Leg press 3x12 90 lbs  Cable Walk 20lbs  TKE 3x12  25 lbs 1st set 35 lbs next 2      11/11/23 Bike level 10, 5 minutes for dynamic warm up Reassessment Lateral step down 4 inch 1 x 10, 6 inch 1 x 10  Mini squat slide out 2 x 5 Single leg hip hinge 2 x 10   11/09/23 Bike level 10, 5 minutes for dynamic warm up Step up 6 inch 2 x 10  Lateral step down 4 inch 3 x 10  Lunge 2 x 10 SLS on airex 3 x 30 second holds Manual: retrograde edema massage, scar mobilizations STS with RLE bias 1 x 10  12/20 Manual passive range of motion into flexion and extension  SLR  3x10,  4 lbs 3x10  LAQ 5 lbs 3x15    TKE with cable 10 lbs 3x15  Cable walk x10 20 lbs   Reviewed and updated HEP   12/17 SLR 3x10, 2.5# SAQ 3x10, 4 #  LAQ 3x10 4 lbs   Step ups 6 inch 2x10  Exercise bike 5 min   Leg press 90 lbs 3x15 using RPE       PATIENT EDUCATION:  Education details: HEP, symptom management, edema management, progression of activities Person educated: Patient Education method: Explanation, Demonstration, Tactile cues, Verbal cues, and Handouts Education comprehension: verbalized understanding, returned demonstration, verbal cues required, tactile cues required, and needs further education  HOME EXERCISE PROGRAM: Access Code: [email or web address] URL: [email or web address] Date: 10/06/2023 Prepared by: Lorayne Bender  ASSESSMENT:  CLINICAL IMPRESSION: The patient has made a significant improvement in knee strength. At this point he is within the parameters to start running. He will be starting work as well though. We will see how he does with work first. We also worked on single leg stability exercises today. We will progress back to return to running.  Eval hold patient is a 38 year old male status post right unicompartmental knee replacement on 10/03/2023.  At this time he presents with expected limitations in knee range of motion, strength, ability to ambulate, and general functional mobility.  He has significant swelling at this time.  His range of motion is limited in flexion.  He would benefit from skilled therapy to return to active lifestyle.  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 OBJECTIVE IMPAIRMENTS: Abnormal gait, decreased activity tolerance, decreased endurance, decreased mobility, difficulty walking, decreased ROM, decreased strength, increased edema, and pain.   ACTIVITY LIMITATIONS: carrying, lifting, bending, standing, squatting, transfers, bed mobility, toileting, dressing, self feeding, and  locomotion level  PARTICIPATION LIMITATIONS: meal prep, cleaning, laundry, driving, shopping, occupation, and yard work  PERSONAL FACTORS: None   REHAB POTENTIAL: Excellent  CLINICAL DECISION MAKING: Stable/uncomplicated  EVALUATION COMPLEXITY: Low   GOALS: Goals reviewed with patient? Yes  SHORT TERM GOALS: Target date: 01/10/2024     Patient will increase right knee strength by 5 lbs  Baseline: Goal status: goal adjusted not tested 12/6 11/11/23: likely improved since eval - MET  2.  Patient will demonstrate full knee extension Baseline:  Goal status: improving -2 12/6 11/11/23: 0 to 125 MET   3.  Patient will ambulate 500 feet without crutches without significant antalgic gait Baseline:  Goal status: working with cane; 11/11/23: MET  4.  Patient will be independent with base exercise program Baseline:  Goal status: Has base exercise program 12/20; MET  5.  Patient will demonstrate a 3 cm reduction in knee edema Goal status: MET  LONG TERM GOALS: Target date:  01/24/2024      Patient will go up and down 8 steps with reciprocal gait pattern Baseline:  Goal status: progressing stairs as tolerated 1/3  2.  Patient will ambulate community distances without pain Baseline:  Goal status: distance imprpving but not met 1/3 3.  Patient will return to jogging when cleared by MD Baseline:  Goal status: progressing 1/3  4.  Patient will be independent with complete exercise program Baseline:  Goal status: INITIAL  PLAN:  PT FREQUENCY: 1-2x/week  PT DURATION: 8 weeks  PLANNED INTERVENTIONS: 97110-Therapeutic exercises, 97530- Therapeutic activity, [id number]- Neuromuscular re-education, 97535- Self Care, [id number]- Manual therapy, [id number]- Gait training, [phone]- Aquatic Therapy, 97014- Electrical stimulation (unattended), [phone]- Ultrasound, Patient/Family education, Stair training, Taping, Dry Needling, DME instructions, Cryotherapy, and  Moist heat   PLAN FOR NEXT SESSION:  Consider vasopneumatic device next visit if he has continued swelling. Continue quad strengthening    Dessie Coma, PT 01/17/2024, 2:04 PM

## 2024-01-17 ENCOUNTER — Encounter (HOSPITAL_BASED_OUTPATIENT_CLINIC_OR_DEPARTMENT_OTHER): Payer: Self-pay | Admitting: Physical Therapy

## 2024-01-21 ENCOUNTER — Encounter (HOSPITAL_BASED_OUTPATIENT_CLINIC_OR_DEPARTMENT_OTHER): Payer: Self-pay | Admitting: Physical Therapy

## 2024-01-22 ENCOUNTER — Ambulatory Visit: Payer: 59

## 2024-01-22 NOTE — Progress Notes (Signed)
 Patient presents today to pick up custom molded foot orthotics, diagnosed with PF by Dr. Ardelle Anton.   Orthotics were dispensed and fit was satisfactory. Reviewed instructions for break-in and wear. Written instructions given to patient.  Patient will follow up as needed.   Jorge Lee CPed CFo, CFm

## 2024-01-23 ENCOUNTER — Encounter (HOSPITAL_BASED_OUTPATIENT_CLINIC_OR_DEPARTMENT_OTHER): Payer: 59 | Admitting: Physical Therapy

## 2024-01-24 ENCOUNTER — Ambulatory Visit (HOSPITAL_BASED_OUTPATIENT_CLINIC_OR_DEPARTMENT_OTHER): Admitting: Physical Therapy

## 2024-01-24 ENCOUNTER — Encounter (HOSPITAL_BASED_OUTPATIENT_CLINIC_OR_DEPARTMENT_OTHER): Payer: Self-pay | Admitting: Physical Therapy

## 2024-01-24 DIAGNOSIS — M25561 Pain in right knee: Secondary | ICD-10-CM | POA: Diagnosis not present

## 2024-01-24 DIAGNOSIS — R600 Localized edema: Secondary | ICD-10-CM

## 2024-01-24 DIAGNOSIS — R2689 Other abnormalities of gait and mobility: Secondary | ICD-10-CM

## 2024-01-24 DIAGNOSIS — M25661 Stiffness of right knee, not elsewhere classified: Secondary | ICD-10-CM

## 2024-01-25 ENCOUNTER — Encounter (HOSPITAL_BASED_OUTPATIENT_CLINIC_OR_DEPARTMENT_OTHER): Payer: Self-pay | Admitting: Physical Therapy

## 2024-01-25 NOTE — Therapy (Signed)
 OUTPATIENT PHYSICAL THERAPY LOWER EXTREMITY TREATMENT   Patient Name: Jorge Lee MRN: [id number] DOB:17-Mar-1986, 38 y.o., male Today's Date: 01/25/2024    END OF SESSION:  PT End of Session - 01/25/24 1132     Visit Number 24    Number of Visits 32    Date for PT Re-Evaluation 02/06/24    PT Start Time 1645    PT Stop Time 1726    PT Time Calculation (min) 41 min    Activity Tolerance Patient tolerated treatment well    Behavior During Therapy WFL for tasks assessed/performed                 Past Medical History:  Diagnosis Date   Narcolepsy    PONV (postoperative nausea and vomiting)    Past Surgical History:  Procedure Laterality Date   KNEE ARTHROSCOPY     x2   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   Procedure: RIGHT  KNEE UNI ARTHROPLASTY;  Surgeon: Huel Cote, MD;  Location: [facility];  Service: Orthopedics;  Laterality: Right;   Patient Active Problem List   Diagnosis Date Noted   Unilateral primary osteoarthritis, right knee 10/03/2023   Baker's cyst of knee, right 09/09/2020   Osteochondritis dissecans of right knee 01/15/2020   Right foot drop 08/02/2019   Posterior right knee pain 12/05/2017    PCP: Leodis Sias MD  REFERRING PROVIDER: Dr Huel Cote   REFERRING DIAG: right UKA 10/03/2023  THERAPY DIAG:  Acute pain of right knee  Stiffness of right knee, not elsewhere classified  Localized edema  Other abnormalities of gait and mobility  Rationale for Evaluation and Treatment: [facility]   ONSET DATE: 10/03/2023  SUBJECTIVE:  Days since surgery: 113  SUBJECTIVE STATEMENT: The patient is back to work. He is doing well. He had a little swelling.  Eval: Patient is a 38 year old male status post right unicompartmental knee replacement.  He has a long history of right knee pain.  He is an avid runner.  His pain and been increasing to the point where he could not run.  At this time he is having significant  pain and swelling particularly at night.  He was using crutches for primary ambulation.  PERTINENT HISTORY: No significant PMH PAIN:  Are you having pain? Yes: NPRS scale: 0/10 right now Pain location: right knee  Pain description: aching  Aggravating factors: night time  Relieving factors: pain meds   PRECAUTIONS: None  RED FLAGS: None   WEIGHT BEARING RESTRICTIONS: Yes WBAT  FALLS:  Has patient fallen in last 6 months? No  LIVING ENVIRONMENT: 10 steps inside   OCCUPATION:  Elementary school physical education teacher  Running:   PLOF: Independent  PATIENT GOALS: To have less pain and to return to running/return to work  NEXT MD VISIT:  Next week  OBJECTIVE:  Note: Objective measures were completed at Evaluation unless otherwise noted.  DIAGNOSTIC FINDINGS: Nothing postop  PATIENT SURVEYS:  FOTO 18% currently 62% expected in 20 visits  11/11/23: 59% function  COGNITION: Overall cognitive status: Within functional limits for tasks assessed     SENSATION: [facility]  EDEMA:  Circumferential: Right 49.4 left 41 cm 11/11/23: 42 cm    POSTURE: No Significant postural limitations  PALPATION:   LOWER EXTREMITY ROM:  Passive ROM Right eval Left eval  Right  12/4 Right  12/13 Right 11/09/23 Right 1/28  Hip flexion         Hip extension  Hip abduction         Hip adduction         Hip internal rotation         Hip external rotation         Knee flexion 38  85 100 115 121 125  Knee extension -6  -2    0  Ankle dorsiflexion         Ankle plantarflexion         Ankle inversion         Ankle eversion          (Blank rows = not tested)  LOWER EXTREMITY MMT:  MMT Right eval Left eval Right 11/11/23 Left 11/11/23  Left   Hip flexion   62.8 70.4    Hip extension        Hip abduction        Hip adduction        Hip internal rotation        Hip external rotation        Knee flexion        Knee extension   50.5 104    Ankle dorsiflexion         Ankle plantarflexion        Ankle inversion        Ankle eversion         (Blank rows = not tested) not taken today secondary to significant swelling   Left 90 124.3 45 84.6  Right 90 53.9  67% deficit   Left 45  74.4 12 % deficit    3/4  90 degrees L 119.2  R 86.7  27   45 L 129 Right 104.9   GAIT: Reviewed how to weight-bear with 2 crutches.  Decreased weightbearing on right side compared to left.  Decreased right hip flexion.  We also reviewed how to use single crutch.  Patient advised not to progress to single crutch until he feels comfortable confident with 2 crutches. TODAY'S TREATMENT:                                                                                                                              DATE:     3/13 There-ex Bike 5 min for warm up  Leg press 3x12 150 lbs  Knee extension 3x12  40 lbs   Neuro-re-ed Airex forward and lateral step 2 x 20 Eccentric step down 2x15 40 lbs   Cable walk 45 lbs 10x fwd and back  Bosu squat 3x15 4 inch step down 2x15    3/4 There-ex Bike 5 min for warm up  Leg press 3x12 140 lbs  Knee extension 3x12  30 lbs   Testing with TNDQ    Neuro-re-ed Airex forward and lateral step 2 x 20 Eccentric step down 2x15 40 lbs   01/11/24 Elliptical 5 minutes for warm up Cable single leg hip hinge 15# 3 x 12 Curtsey lunge  2 x 10, 5# DB 1 x 10 with D2 lift Split squat 2 x 10 Step up and over bosu 3 x 10 Airex forward and lateral step hops 3 x 20   10/08/24 There-ex Bike 5 min for warm up  Leg press 3x12 140 lbs  Knee extension 3x12  30 lbs   Nuro-Re-ed   Cable walk 45 lbs 10x fwd and back  Airex forward and lateral step 2 x 20 Pallof Press 2x15 27 lbs    01/05/24 Bike 5 min for warm up  Cable single leg hip hinge 3 x 12 Curtsey lunge 2 x 10 Step up and over bosu 2 x 10 Airex forward and lateral step 2 x 20  2/19 There-ex Bike 5 min for warm up  Leg press 3x12 100 lbs  Knee  extension 3x12  30 lbs   Nuro-Re-ed   Cable walk 45 lbs 10x fwd and back  Rebounder 2 x 20 Airex forward and lateral step 2 x 20 KB dead lift 26 pound from 6 inch 3 x 12   Last visit:   There-ex: Bike 5 min for warm up  SL SLR 3 lbs 3x12 Leg press 3x12 100 lbs  Sidelying shuttle 75# 3 x 10 Knee extension 3x12  30 lbs There-Act  Self Care  Nuro-Re-ed  Cable walk 45 lbs 10x fwd and back  Eccentric step down 4 inch 2x15 sec hold    12/19/23 Bike 5 minutes Lateral step down 6 inch 1 x 10 Sidelying hip abduction 2# 4 x 10 Sidelying shuttle 75# 3 x 10 Hip hike off step 2 x 10   1/28 Elliptical 6 min   Leg press 55 lbs 3x12  Knee extension 3x12  30 lbs   Tindq testing  Reviewed results   Deep squat TRX 2x20  Goblet squat 15 lbs 2x15     12/07/23 Elliptical level 4 5 minutes for dynamic warm up Cable Walk 25lbs  x10 retro Lateral step down 8 inch 2 x 10 Airex step on forward and lateral x 20 Discussion of dynamic exercises and performance of them  1/13 Ex bike 6 min L10   SLE 3x12 4 lbs   Cable Walk 25lbs  x10 each direction  TKE 3x12  35 lbs   Knee extension machine 3x12 20 lbs  Airex step on forward and lateral x 20  1/7 Ex bike 6 min L10  LAQ 3x12 4 lbs  SLR 3x12 4 lbs    Cable Walk 25lbs  x10 each direction  TKE 3x12  25 lbs 1st set 35 lbs next 2   Manual: extension stretching         PATIENT EDUCATION:  Education details: HEP, symptom management, edema management, progression of activities Person educated: Patient Education method: Explanation, Demonstration, Tactile cues, Verbal cues, and Handouts Education comprehension: verbalized understanding, returned demonstration, verbal cues required, tactile cues required, and needs further education  HOME EXERCISE PROGRAM: Access Code: [email or web address] URL: [email or web address] Date: 10/06/2023 Prepared by: Lorayne Bender  ASSESSMENT:  CLINICAL IMPRESSION: The patient  continues to make progress. We keep advancing his weights and working on eccentric loading. We will continue to progress as tolerated.    Eval hold patient is a 38 year old male status post right unicompartmental knee replacement on 10/03/2023.  At this time he presents with expected limitations in knee range of motion, strength, ability to ambulate, and general functional mobility.  He has significant swelling at this time.  His range of motion is limited  in flexion.  He would benefit from skilled therapy to return to active lifestyle.  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 OBJECTIVE IMPAIRMENTS: Abnormal gait, decreased activity tolerance, decreased endurance, decreased mobility, difficulty walking, decreased ROM, decreased strength, increased edema, and pain.   ACTIVITY LIMITATIONS: carrying, lifting, bending, standing, squatting, transfers, bed mobility, toileting, dressing, self feeding, and locomotion level  PARTICIPATION LIMITATIONS: meal prep, cleaning, laundry, driving, shopping, occupation, and yard work  PERSONAL FACTORS: None   REHAB POTENTIAL: Excellent  CLINICAL DECISION MAKING: Stable/uncomplicated  EVALUATION COMPLEXITY: Low   GOALS: Goals reviewed with patient? Yes  SHORT TERM GOALS: Target date: 01/10/2024     Patient will increase right knee strength by 5 lbs  Baseline: Goal status: goal adjusted not tested 12/6 11/11/23: likely improved since eval - MET  2.  Patient will demonstrate full knee extension Baseline:  Goal status: improving -2 12/6 11/11/23: 0 to 125 MET   3.  Patient will ambulate 500 feet without crutches without significant antalgic gait Baseline:  Goal status: working with cane; 11/11/23: MET  4.  Patient will be independent with base exercise program Baseline:  Goal status: Has base exercise program 12/20; MET  5.  Patient will demonstrate a 3 cm reduction in knee edema Goal status: MET  LONG TERM GOALS:  Target date:  01/24/2024      Patient will go up and down 8 steps with reciprocal gait pattern Baseline:  Goal status: progressing stairs as tolerated 1/3  2.  Patient will ambulate community distances without pain Baseline:  Goal status: distance imprpving but not met 1/3 3.  Patient will return to jogging when cleared by MD Baseline:  Goal status: progressing 1/3  4.  Patient will be independent with complete exercise program Baseline:  Goal status: INITIAL  PLAN:  PT FREQUENCY: 1-2x/week  PT DURATION: 8 weeks  PLANNED INTERVENTIONS: 97110-Therapeutic exercises, 97530- Therapeutic activity, [id number]- Neuromuscular re-education, 97535- Self Care, [id number]- Manual therapy, [id number]- Gait training, [phone]- Aquatic Therapy, 97014- Electrical stimulation (unattended), 97035- Ultrasound, Patient/Family education, Stair training, Taping, Dry Needling, DME instructions, Cryotherapy, and Moist heat   PLAN FOR NEXT SESSION: Consider vasopneumatic device next visit if he has continued swelling. Continue quad strengthening    Dessie Coma, PT 01/25/2024, 11:35 AM

## 2024-01-26 ENCOUNTER — Encounter: Payer: Self-pay | Admitting: Podiatry

## 2024-01-31 ENCOUNTER — Ambulatory Visit (HOSPITAL_BASED_OUTPATIENT_CLINIC_OR_DEPARTMENT_OTHER): Payer: 59 | Admitting: Physical Therapy

## 2024-01-31 ENCOUNTER — Encounter (HOSPITAL_BASED_OUTPATIENT_CLINIC_OR_DEPARTMENT_OTHER): Payer: Self-pay | Admitting: Physical Therapy

## 2024-01-31 DIAGNOSIS — M25561 Pain in right knee: Secondary | ICD-10-CM

## 2024-01-31 DIAGNOSIS — M25661 Stiffness of right knee, not elsewhere classified: Secondary | ICD-10-CM

## 2024-01-31 DIAGNOSIS — R2689 Other abnormalities of gait and mobility: Secondary | ICD-10-CM

## 2024-01-31 DIAGNOSIS — R600 Localized edema: Secondary | ICD-10-CM

## 2024-01-31 NOTE — Therapy (Signed)
 OUTPATIENT PHYSICAL THERAPY LOWER EXTREMITY TREATMENT   Patient Name: Jorge Lee MRN: [id number] DOB:07/02/1986, 38 y.o., male Today's Date: 01/31/2024    END OF SESSION:  PT End of Session - 01/31/24 1521     Visit Number 25    Number of Visits 32    Date for PT Re-Evaluation 02/06/24    PT Start Time 1519    PT Stop Time 1559    PT Time Calculation (min) 40 min    Activity Tolerance Patient tolerated treatment well    Behavior During Therapy WFL for tasks assessed/performed                 Past Medical History:  Diagnosis Date   Narcolepsy    PONV (postoperative nausea and vomiting)    Past Surgical History:  Procedure Laterality Date   KNEE ARTHROSCOPY     x2   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   Procedure: RIGHT  KNEE UNI ARTHROPLASTY;  Surgeon: Huel Cote, MD;  Location: [facility];  Service: Orthopedics;  Laterality: Right;   Patient Active Problem List   Diagnosis Date Noted   Unilateral primary osteoarthritis, right knee 10/03/2023   Baker's cyst of knee, right 09/09/2020   Osteochondritis dissecans of right knee 01/15/2020   Right foot drop 08/02/2019   Posterior right knee pain 12/05/2017    PCP: Leodis Sias MD  REFERRING PROVIDER: Dr Huel Cote   REFERRING DIAG: right UKA 10/03/2023  THERAPY DIAG:  Acute pain of right knee  Stiffness of right knee, not elsewhere classified  Localized edema  Other abnormalities of gait and mobility  Rationale for Evaluation and Treatment: [facility]   ONSET DATE: 10/03/2023  SUBJECTIVE:  Days since surgery: 120  SUBJECTIVE STATEMENT: The patient is back to work. He is doing well. He had a little swelling.  Eval: Patient is a 38 year old male status post right unicompartmental knee replacement.  He has a long history of right knee pain.  He is an avid runner.  His pain and been increasing to the point where he could not run.  At this time he is having significant  pain and swelling particularly at night.  He was using crutches for primary ambulation.  PERTINENT HISTORY: No significant PMH PAIN:  Are you having pain? Yes: NPRS scale: 0/10 right now Pain location: right knee  Pain description: aching  Aggravating factors: night time  Relieving factors: pain meds   PRECAUTIONS: None  RED FLAGS: None   WEIGHT BEARING RESTRICTIONS: Yes WBAT  FALLS:  Has patient fallen in last 6 months? No  LIVING ENVIRONMENT: 10 steps inside   OCCUPATION:  Elementary school physical education teacher  Running:   PLOF: Independent  PATIENT GOALS: To have less pain and to return to running/return to work  NEXT MD VISIT:  Next week  OBJECTIVE:  Note: Objective measures were completed at Evaluation unless otherwise noted.  DIAGNOSTIC FINDINGS: Nothing postop  PATIENT SURVEYS:  FOTO 18% currently 62% expected in 20 visits  11/11/23: 59% function  COGNITION: Overall cognitive status: Within functional limits for tasks assessed     SENSATION: [facility]  EDEMA:  Circumferential: Right 49.4 left 41 cm 11/11/23: 42 cm    POSTURE: No Significant postural limitations  PALPATION:   LOWER EXTREMITY ROM:  Passive ROM Right eval Left eval  Right  12/4 Right  12/13 Right 11/09/23 Right 1/28  Hip flexion         Hip extension  Hip abduction         Hip adduction         Hip internal rotation         Hip external rotation         Knee flexion 38  85 100 115 121 125  Knee extension -6  -2    0  Ankle dorsiflexion         Ankle plantarflexion         Ankle inversion         Ankle eversion          (Blank rows = not tested)  LOWER EXTREMITY MMT:  MMT Right eval Left eval Right 11/11/23 Left 11/11/23  Left   Hip flexion   62.8 70.4    Hip extension        Hip abduction        Hip adduction        Hip internal rotation        Hip external rotation        Knee flexion        Knee extension   50.5 104    Ankle dorsiflexion         Ankle plantarflexion        Ankle inversion        Ankle eversion         (Blank rows = not tested) not taken today secondary to significant swelling   Left 90 124.3 45 84.6  Right 90 53.9  67% deficit   Left 45  74.4 12 % deficit    3/4  90 degrees L 119.2  R 86.7  27   45 L 129 Right 104.9   GAIT: Reviewed how to weight-bear with 2 crutches.  Decreased weightbearing on right side compared to left.  Decreased right hip flexion.  We also reviewed how to use single crutch.  Patient advised not to progress to single crutch until he feels comfortable confident with 2 crutches. TODAY'S TREATMENT:                                                                                                                              DATE:  01/31/24 Elliptical 5 minutes Resisted retro gait 35# 1 x 10 Cable single leg hip hinge 15# 3 x 12 Split squat 10# 3 x 10 Step up onto 9 inch mat with knee drive 3 x 10   5/78 There-ex Bike 5 min for warm up  Leg press 3x12 150 lbs  Knee extension 3x12  40 lbs   Neuro-re-ed Airex forward and lateral step 2 x 20 Eccentric step down 2x15 40 lbs   Cable walk 45 lbs 10x fwd and back  Bosu squat 3x15 4 inch step down 2x15    3/4 There-ex Bike 5 min for warm up  Leg press 3x12 140 lbs  Knee extension 3x12  30 lbs   Testing with TNDQ  Neuro-re-ed Airex forward and lateral step 2 x 20 Eccentric step down 2x15 40 lbs   01/11/24 Elliptical 5 minutes for warm up Cable single leg hip hinge 15# 3 x 12 Curtsey lunge 2 x 10, 5# DB 1 x 10 with D2 lift Split squat 2 x 10 Step up and over bosu 3 x 10 Airex forward and lateral step hops 3 x 20   10/08/24 There-ex Bike 5 min for warm up  Leg press 3x12 140 lbs  Knee extension 3x12  30 lbs   Nuro-Re-ed   Cable walk 45 lbs 10x fwd and back  Airex forward and lateral step 2 x 20 Pallof Press 2x15 27 lbs    01/05/24 Bike 5 min for warm up  Cable single leg hip hinge 3 x  12 Curtsey lunge 2 x 10 Step up and over bosu 2 x 10 Airex forward and lateral step 2 x 20  2/19 There-ex Bike 5 min for warm up  Leg press 3x12 100 lbs  Knee extension 3x12  30 lbs   Nuro-Re-ed   Cable walk 45 lbs 10x fwd and back  Rebounder 2 x 20 Airex forward and lateral step 2 x 20 KB dead lift 26 pound from 6 inch 3 x 12   Last visit:   There-ex: Bike 5 min for warm up  SL SLR 3 lbs 3x12 Leg press 3x12 100 lbs  Sidelying shuttle 75# 3 x 10 Knee extension 3x12  30 lbs There-Act  Self Care  Nuro-Re-ed  Cable walk 45 lbs 10x fwd and back  Eccentric step down 4 inch 2x15 sec hold    12/19/23 Bike 5 minutes Lateral step down 6 inch 1 x 10 Sidelying hip abduction 2# 4 x 10 Sidelying shuttle 75# 3 x 10 Hip hike off step 2 x 10   1/28 Elliptical 6 min   Leg press 55 lbs 3x12  Knee extension 3x12  30 lbs   Tindq testing  Reviewed results   Deep squat TRX 2x20  Goblet squat 15 lbs 2x15     12/07/23 Elliptical level 4 5 minutes for dynamic warm up Cable Walk 25lbs  x10 retro Lateral step down 8 inch 2 x 10 Airex step on forward and lateral x 20 Discussion of dynamic exercises and performance of them  1/13 Ex bike 6 min L10   SLE 3x12 4 lbs   Cable Walk 25lbs  x10 each direction  TKE 3x12  35 lbs   Knee extension machine 3x12 20 lbs  Airex step on forward and lateral x 20  1/7 Ex bike 6 min L10  LAQ 3x12 4 lbs  SLR 3x12 4 lbs    Cable Walk 25lbs  x10 each direction  TKE 3x12  25 lbs 1st set 35 lbs next 2   Manual: extension stretching         PATIENT EDUCATION:  Education details: HEP, symptom management, edema management, progression of activities Person educated: Patient Education method: Explanation, Demonstration, Tactile cues, Verbal cues, and Handouts Education comprehension: verbalized understanding, returned demonstration, verbal cues required, tactile cues required, and needs further education  HOME EXERCISE  PROGRAM: Access Code: [email or web address] URL: [email or web address] Date: 10/06/2023 Prepared by: Lorayne Bender  ASSESSMENT:  CLINICAL IMPRESSION: Continues to progress well with advanced strengthening. Advanced resistance as able. Patient will continue to benefit from physical therapy in order to improve function and reduce impairment.    Eval hold patient is a 38 year old male status post right unicompartmental knee replacement  on 10/03/2023.  At this time he presents with expected limitations in knee range of motion, strength, ability to ambulate, and general functional mobility.  He has significant swelling at this time.  His range of motion is limited in flexion.  He would benefit from skilled therapy to return to active lifestyle.  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 OBJECTIVE IMPAIRMENTS: Abnormal gait, decreased activity tolerance, decreased endurance, decreased mobility, difficulty walking, decreased ROM, decreased strength, increased edema, and pain.   ACTIVITY LIMITATIONS: carrying, lifting, bending, standing, squatting, transfers, bed mobility, toileting, dressing, self feeding, and locomotion level  PARTICIPATION LIMITATIONS: meal prep, cleaning, laundry, driving, shopping, occupation, and yard work  PERSONAL FACTORS: None   REHAB POTENTIAL: Excellent  CLINICAL DECISION MAKING: Stable/uncomplicated  EVALUATION COMPLEXITY: Low   GOALS: Goals reviewed with patient? Yes  SHORT TERM GOALS: Target date: 01/10/2024     Patient will increase right knee strength by 5 lbs  Baseline: Goal status: goal adjusted not tested 12/6 11/11/23: likely improved since eval - MET  2.  Patient will demonstrate full knee extension Baseline:  Goal status: improving -2 12/6 11/11/23: 0 to 125 MET   3.  Patient will ambulate 500 feet without crutches without significant antalgic gait Baseline:  Goal status: working with cane; 11/11/23: MET  4.   Patient will be independent with base exercise program Baseline:  Goal status: Has base exercise program 12/20; MET  5.  Patient will demonstrate a 3 cm reduction in knee edema Goal status: MET  LONG TERM GOALS: Target date:  01/24/2024      Patient will go up and down 8 steps with reciprocal gait pattern Baseline:  Goal status: progressing stairs as tolerated 1/3  2.  Patient will ambulate community distances without pain Baseline:  Goal status: distance imprpving but not met 1/3 3.  Patient will return to jogging when cleared by MD Baseline:  Goal status: progressing 1/3  4.  Patient will be independent with complete exercise program Baseline:  Goal status: INITIAL  PLAN:  PT FREQUENCY: 1-2x/week  PT DURATION: 8 weeks  PLANNED INTERVENTIONS: 97110-Therapeutic exercises, 97530- Therapeutic activity, [id number]- Neuromuscular re-education, 97535- Self Care, [id number]- Manual therapy, [id number]- Gait training, [id number]- Aquatic Therapy, [id number]- Electrical stimulation (unattended), 97035- Ultrasound, Patient/Family education, Stair training, Taping, Dry Needling, DME instructions, Cryotherapy, and Moist heat   PLAN FOR NEXT SESSION: Consider vasopneumatic device next visit if he has continued swelling. Continue quad strengthening    Wyman Songster, PT 01/31/2024, 3:23 PM

## 2024-02-06 ENCOUNTER — Encounter: Payer: Self-pay | Admitting: Podiatry

## 2024-02-06 LAB — CBC WITH DIFFERENTIAL/PLATELET
Basophils Absolute: 0.1 10*3/uL (ref 0.0–0.2)
Basos: 1 %
EOS (ABSOLUTE): 0.2 10*3/uL (ref 0.0–0.4)
Eos: 2 %
Hematocrit: 46.2 % (ref 37.5–51.0)
Hemoglobin: 16 g/dL (ref 13.0–17.7)
Immature Grans (Abs): 0 10*3/uL (ref 0.0–0.1)
Immature Granulocytes: 0 %
Lymphocytes Absolute: 2 10*3/uL (ref 0.7–3.1)
Lymphs: 26 %
MCH: 31.1 pg (ref 26.6–33.0)
MCHC: 34.6 g/dL (ref 31.5–35.7)
MCV: 90 fL (ref 79–97)
Monocytes Absolute: 0.7 10*3/uL (ref 0.1–0.9)
Monocytes: 9 %
Neutrophils Absolute: 4.9 10*3/uL (ref 1.4–7.0)
Neutrophils: 62 %
Platelets: 290 10*3/uL (ref 150–450)
RBC: 5.15 x10E6/uL (ref 4.14–5.80)
RDW: 12 % (ref 11.6–15.4)
WBC: 7.9 10*3/uL (ref 3.4–10.8)

## 2024-02-06 LAB — HEPATIC FUNCTION PANEL
ALT: 30 IU/L (ref 0–44)
AST: 29 IU/L (ref 0–40)
Albumin: 4.7 g/dL (ref 4.1–5.1)
Alkaline Phosphatase: 97 IU/L (ref 44–121)
Bilirubin Total: 0.6 mg/dL (ref 0.0–1.2)
Bilirubin, Direct: 0.17 mg/dL (ref 0.00–0.40)
Total Protein: 7.1 g/dL (ref 6.0–8.5)

## 2024-02-07 ENCOUNTER — Ambulatory Visit (HOSPITAL_BASED_OUTPATIENT_CLINIC_OR_DEPARTMENT_OTHER): Payer: 59 | Admitting: Physical Therapy

## 2024-02-07 ENCOUNTER — Encounter (HOSPITAL_BASED_OUTPATIENT_CLINIC_OR_DEPARTMENT_OTHER): Payer: Self-pay | Admitting: Physical Therapy

## 2024-02-07 DIAGNOSIS — M25561 Pain in right knee: Secondary | ICD-10-CM | POA: Diagnosis not present

## 2024-02-07 DIAGNOSIS — R600 Localized edema: Secondary | ICD-10-CM

## 2024-02-07 DIAGNOSIS — M25661 Stiffness of right knee, not elsewhere classified: Secondary | ICD-10-CM

## 2024-02-07 DIAGNOSIS — R2689 Other abnormalities of gait and mobility: Secondary | ICD-10-CM

## 2024-02-07 NOTE — Therapy (Signed)
 OUTPATIENT PHYSICAL THERAPY LOWER EXTREMITY TREATMENT   Patient Name: Jorge Lee MRN: [id number] DOB:1986/09/24, 38 y.o., male Today's Date: 02/07/2024  PHYSICAL THERAPY DISCHARGE SUMMARY  Visits from Start of Care: 26  Current functional level related to goals / functional outcomes: See below   Remaining deficits: See below   Education / Equipment: See below   Patient agrees to discharge. Patient goals were met. Patient is being discharged due to meeting the stated rehab goals.   END OF SESSION:  PT End of Session - 02/07/24 1517     Visit Number 26    Number of Visits 32    Date for PT Re-Evaluation 02/06/24    PT Start Time 1515    PT Stop Time 1554    PT Time Calculation (min) 39 min    Activity Tolerance Patient tolerated treatment well    Behavior During Therapy WFL for tasks assessed/performed                 Past Medical History:  Diagnosis Date   Narcolepsy    PONV (postoperative nausea and vomiting)    Past Surgical History:  Procedure Laterality Date   KNEE ARTHROSCOPY     x2   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   Procedure: RIGHT  KNEE UNI ARTHROPLASTY;  Surgeon: Huel Cote, MD;  Location: [facility];  Service: Orthopedics;  Laterality: Right;   Patient Active Problem List   Diagnosis Date Noted   Unilateral primary osteoarthritis, right knee 10/03/2023   Baker's cyst of knee, right 09/09/2020   Osteochondritis dissecans of right knee 01/15/2020   Right foot drop 08/02/2019   Posterior right knee pain 12/05/2017    PCP: Leodis Sias MD  REFERRING PROVIDER: Dr Huel Cote   REFERRING DIAG: right UKA 10/03/2023  THERAPY DIAG:  Acute pain of right knee  Stiffness of right knee, not elsewhere classified  Localized edema  Other abnormalities of gait and mobility  Rationale for Evaluation and Treatment: [facility]   ONSET DATE: 10/03/2023  SUBJECTIVE:  Days since surgery: 127  SUBJECTIVE  STATEMENT: Patient states knee is feeling good. Ready to start running. Feels that he needs to keep working out and do HEP. Feels about ready to d/c. Patient at about 80% currently.  Eval: Patient is a 38 year old male status post right unicompartmental knee replacement.  He has a long history of right knee pain.  He is an avid runner.  His pain and been increasing to the point where he could not run.  At this time he is having significant pain and swelling particularly at night.  He was using crutches for primary ambulation.  PERTINENT HISTORY: No significant PMH PAIN:  Are you having pain? Yes: NPRS scale: 0/10 right now Pain location: right knee  Pain description: aching  Aggravating factors: night time  Relieving factors: pain meds   PRECAUTIONS: None  RED FLAGS: None   WEIGHT BEARING RESTRICTIONS: Yes WBAT  FALLS:  Has patient fallen in last 6 months? No  LIVING ENVIRONMENT: 10 steps inside   OCCUPATION:  Elementary school physical education teacher  Running:   PLOF: Independent  PATIENT GOALS: To have less pain and to return to running/return to work  NEXT MD VISIT:  Next week  OBJECTIVE:  Note: Objective measures were completed at Evaluation unless otherwise noted.  DIAGNOSTIC FINDINGS: Nothing postop  PATIENT SURVEYS:  FOTO 18% currently 62% expected in 20 visits  11/11/23: 59% function  COGNITION: Overall cognitive status: Within functional limits  for tasks assessed     SENSATION: [facility]  EDEMA:  Circumferential: Right 49.4 left 41 cm 11/11/23: 42 cm    POSTURE: No Significant postural limitations  PALPATION:   LOWER EXTREMITY ROM:  Passive ROM Right eval Left eval  Right  12/4 Right  12/13 Right 11/09/23 Right 1/28  Hip flexion         Hip extension         Hip abduction         Hip adduction         Hip internal rotation         Hip external rotation         Knee flexion 38  85 100 115 121 125  Knee extension -6  -2    0  Ankle  dorsiflexion         Ankle plantarflexion         Ankle inversion         Ankle eversion          (Blank rows = not tested)  LOWER EXTREMITY MMT:  MMT Right eval Left eval Right 11/11/23 Left 11/11/23  Left   Hip flexion   62.8 70.4    Hip extension        Hip abduction        Hip adduction        Hip internal rotation        Hip external rotation        Knee flexion        Knee extension   50.5 104    Ankle dorsiflexion        Ankle plantarflexion        Ankle inversion        Ankle eversion         (Blank rows = not tested) not taken today secondary to significant swelling   Left 90 124.3 45 84.6  Right 90 53.9  67% deficit   Left 45  74.4 12 % deficit    3/4  90 degrees L 119.2  R 86.7  27   45 L 129 Right 104.9  02/07/24 90 degrees R: 109.3 L: 116.4 % deficit = 6%  45 degrees R: 110.5 L: 100.2  % deficit = 9% greater R>L  GAIT: Reviewed how to weight-bear with 2 crutches.  Decreased weightbearing on right side compared to left.  Decreased right hip flexion.  We also reviewed how to use single crutch.  Patient advised not to progress to single crutch until he feels comfortable confident with 2 crutches. TODAY'S TREATMENT:                                                                                                                              DATE:  02/07/24 Elliptical 5 minutes reassessment  01/31/24 Elliptical 5 minutes Resisted retro gait 35# 1 x 10 Cable single leg hip hinge 15# 3 x 12 Split  squat 10# 3 x 10 Step up onto 9 inch mat with knee drive 3 x 10   4/09 There-ex Bike 5 min for warm up  Leg press 3x12 150 lbs  Knee extension 3x12  40 lbs   Neuro-re-ed Airex forward and lateral step 2 x 20 Eccentric step down 2x15 40 lbs   Cable walk 45 lbs 10x fwd and back  Bosu squat 3x15 4 inch step down 2x15    3/4 There-ex Bike 5 min for warm up  Leg press 3x12 140 lbs  Knee extension 3x12  30 lbs   Testing with TNDQ     Neuro-re-ed Airex forward and lateral step 2 x 20 Eccentric step down 2x15 40 lbs   01/11/24 Elliptical 5 minutes for warm up Cable single leg hip hinge 15# 3 x 12 Curtsey lunge 2 x 10, 5# DB 1 x 10 with D2 lift Split squat 2 x 10 Step up and over bosu 3 x 10 Airex forward and lateral step hops 3 x 20   10/08/24 There-ex Bike 5 min for warm up  Leg press 3x12 140 lbs  Knee extension 3x12  30 lbs   Nuro-Re-ed   Cable walk 45 lbs 10x fwd and back  Airex forward and lateral step 2 x 20 Pallof Press 2x15 27 lbs    01/05/24 Bike 5 min for warm up  Cable single leg hip hinge 3 x 12 Curtsey lunge 2 x 10 Step up and over bosu 2 x 10 Airex forward and lateral step 2 x 20  2/19 There-ex Bike 5 min for warm up  Leg press 3x12 100 lbs  Knee extension 3x12  30 lbs   Nuro-Re-ed   Cable walk 45 lbs 10x fwd and back  Rebounder 2 x 20 Airex forward and lateral step 2 x 20 KB dead lift 26 pound from 6 inch 3 x 12   Last visit:   There-ex: Bike 5 min for warm up  SL SLR 3 lbs 3x12 Leg press 3x12 100 lbs  Sidelying shuttle 75# 3 x 10 Knee extension 3x12  30 lbs There-Act  Self Care  Nuro-Re-ed  Cable walk 45 lbs 10x fwd and back  Eccentric step down 4 inch 2x15 sec hold    12/19/23 Bike 5 minutes Lateral step down 6 inch 1 x 10 Sidelying hip abduction 2# 4 x 10 Sidelying shuttle 75# 3 x 10 Hip hike off step 2 x 10   1/28 Elliptical 6 min   Leg press 55 lbs 3x12  Knee extension 3x12  30 lbs   Tindq testing  Reviewed results   Deep squat TRX 2x20  Goblet squat 15 lbs 2x15     12/07/23 Elliptical level 4 5 minutes for dynamic warm up Cable Walk 25lbs  x10 retro Lateral step down 8 inch 2 x 10 Airex step on forward and lateral x 20 Discussion of dynamic exercises and performance of them  1/13 Ex bike 6 min L10   SLE 3x12 4 lbs   Cable Walk 25lbs  x10 each direction  TKE 3x12  35 lbs   Knee extension machine 3x12 20 lbs  Airex step on  forward and lateral x 20  1/7 Ex bike 6 min L10  LAQ 3x12 4 lbs  SLR 3x12 4 lbs    Cable Walk 25lbs  x10 each direction  TKE 3x12  25 lbs 1st set 35 lbs next 2   Manual: extension stretching  PATIENT EDUCATION:  Education details: HEP, symptom management, edema management, progression of activities Person educated: Patient Education method: Explanation, Demonstration, Tactile cues, Verbal cues, and Handouts Education comprehension: verbalized understanding, returned demonstration, verbal cues required, tactile cues required, and needs further education  HOME EXERCISE PROGRAM: Access Code: [email or web address] URL: [email or web address] Date: 10/06/2023 Prepared by: Lorayne Bender  ASSESSMENT:  CLINICAL IMPRESSION: Patient has met all goals and is doing very well. Patient with great strength and progression with return to running and plyometric exercise. He was able to jog recently for short distance without issue. Discussed progress and continued progression with HEP, return to run, etc. Patient discharged from PT and is instructed to return if needed.     Eval hold patient is a 38 year old male status post right unicompartmental knee replacement on 10/03/2023.  At this time he presents with expected limitations in knee range of motion, strength, ability to ambulate, and general functional mobility.  He has significant swelling at this time.  His range of motion is limited in flexion.  He would benefit from skilled therapy to return to active lifestyle.  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 OBJECTIVE IMPAIRMENTS: Abnormal gait, decreased activity tolerance, decreased endurance, decreased mobility, difficulty walking, decreased ROM, decreased strength, increased edema, and pain.   ACTIVITY LIMITATIONS: carrying, lifting, bending, standing, squatting, transfers, bed mobility, toileting, dressing, self feeding, and locomotion  level  PARTICIPATION LIMITATIONS: meal prep, cleaning, laundry, driving, shopping, occupation, and yard work  PERSONAL FACTORS: None   REHAB POTENTIAL: Excellent  CLINICAL DECISION MAKING: Stable/uncomplicated  EVALUATION COMPLEXITY: Low   GOALS: Goals reviewed with patient? Yes  SHORT TERM GOALS: Target date: 01/10/2024     Patient will increase right knee strength by 5 lbs  Baseline: Goal status: goal adjusted not tested 12/6 11/11/23: likely improved since eval - MET  2.  Patient will demonstrate full knee extension Baseline:  Goal status: improving -2 12/6 11/11/23: 0 to 125 MET   3.  Patient will ambulate 500 feet without crutches without significant antalgic gait Baseline:  Goal status: working with cane; 11/11/23: MET  4.  Patient will be independent with base exercise program Baseline:  Goal status: Has base exercise program 12/20; MET  5.  Patient will demonstrate a 3 cm reduction in knee edema Goal status: MET  LONG TERM GOALS: Target date:  01/24/2024      Patient will go up and down 8 steps with reciprocal gait pattern Baseline:  Goal status: progressing stairs as tolerated 1/3  2.  Patient will ambulate community distances without pain Baseline:  Goal status: MET 3.  Patient will return to jogging when cleared by MD Baseline:  Goal status: MET  4.  Patient will be independent with complete exercise program Baseline:  Goal status: MET  PLAN:  PT FREQUENCY: 1-2x/week  PT DURATION: 8 weeks  PLANNED INTERVENTIONS: 97110-Therapeutic exercises, 97530- Therapeutic activity, [id number]- Neuromuscular re-education, 97535- Self Care, [id number]- Manual therapy, [id number]- Gait training, [phone]- Aquatic Therapy, 97014- Electrical stimulation (unattended), 97035- Ultrasound, Patient/Family education, Stair training, Taping, Dry Needling, DME instructions, Cryotherapy, and Moist heat   PLAN FOR NEXT SESSION: Consider vasopneumatic device next visit if he has  continued swelling. Continue quad strengthening    Wyman Songster, PT 02/07/2024, 3:17 PM

## 2024-02-12 NOTE — Telephone Encounter (Signed)
 Can we put him on for Wed at 3:15? I will be here. Thanks!

## 2024-02-14 ENCOUNTER — Encounter: Payer: Self-pay | Admitting: Podiatry

## 2024-02-14 ENCOUNTER — Ambulatory Visit: Admitting: Podiatry

## 2024-02-14 DIAGNOSIS — L02419 Cutaneous abscess of limb, unspecified: Secondary | ICD-10-CM

## 2024-02-14 DIAGNOSIS — L03119 Cellulitis of unspecified part of limb: Secondary | ICD-10-CM | POA: Diagnosis not present

## 2024-02-14 MED ORDER — DOXYCYCLINE HYCLATE 100 MG PO TABS: 100.0000 mg | ORAL_TABLET | Freq: Two times a day (BID) | ORAL | 0 refills | Status: DC

## 2024-02-14 NOTE — Patient Instructions (Signed)

## 2024-02-17 LAB — WOUND CULTURE
MICRO NUMBER:: 16279751
SPECIMEN QUALITY:: ADEQUATE

## 2024-02-18 NOTE — Progress Notes (Signed)
 Subjective: Chief Complaint  Patient presents with   Foot Pain    RM#3 Patient present today with itchy areas on the bottom of feet after starting antifungal treatment started on right foot now have moved to left foot. Blisters on bottom of feet more itchiness but does have pain.Has started using inserts as well his feet feel good.   38 year old male presents the office today with concerns of blisters, possible warts on the bottom of his feet with the left side worse than the right.  This is a started a couple weeks ago.  He has no open drainage or pus or any swelling or redness.  Area is tender on the left foot pressure.  No fevers or chills.  This does seem to start about 1 to 2 weeks after he started wearing his new inserts.  He is also been on Lamisil.  No other rashes on his body.  Objective: AAO x3, NAD DP/PT pulses palpable bilaterally, CRT less than 3 seconds On the plantar aspect of left foot there are multiple areas of calluses and upon draining serous clear fluid expressed but there is no frank purulence.  There is no erythema, drainage or pus otherwise.  No fluctuation or crepitation. No pain with calf compression, swelling, warmth, erythema      Assessment: Skin lesions left side worse than right  Plan: -All treatment options discussed with the patient including all alternatives, risks, complications.  -I cleaned the area of alcohol and I did puncture one of the lesions I sent the fluid for culture. -Prescribed doxycycline -Discussed Epsom salt soaks -I do not think this is allergic reaction to the Lamisil.  He did start after he started wearing orthotics and he potentially could be having some reaction to this.  Recommended we do new top covers.  Discussed this with Bethann Berkshire, pedorthist. -Monitor for any clinical signs or symptoms of infection and directed to call the office immediately should any occur or go to the ER. -Patient encouraged to call the office with any  questions, concerns, change in symptoms.   Vivi Barrack DPM

## 2024-02-19 ENCOUNTER — Encounter: Payer: Self-pay | Admitting: Podiatry

## 2024-03-04 ENCOUNTER — Encounter: Payer: Self-pay | Admitting: Podiatry

## 2024-03-04 ENCOUNTER — Ambulatory Visit: Admitting: Podiatry

## 2024-03-04 DIAGNOSIS — B351 Tinea unguium: Secondary | ICD-10-CM | POA: Diagnosis not present

## 2024-03-04 DIAGNOSIS — L02419 Cutaneous abscess of limb, unspecified: Secondary | ICD-10-CM

## 2024-03-04 DIAGNOSIS — L03119 Cellulitis of unspecified part of limb: Secondary | ICD-10-CM | POA: Diagnosis not present

## 2024-03-04 MED ORDER — DOXYCYCLINE HYCLATE 100 MG PO TABS: 100.0000 mg | ORAL_TABLET | Freq: Two times a day (BID) | ORAL | 0 refills | Status: DC

## 2024-03-04 NOTE — Progress Notes (Signed)
 Subjective: Chief Complaint  Patient presents with   BLISTERS    RM#11  Follow up on blisters on bottom of feet blisters are now drying up.    38 year old male presents the office today for follow evaluation of blisters to his feet.  He feels that they are doing better and they are drying out.  Denies any drainage please asked, mother round of antibiotics.  His feet do not itch.   He is asked about his insoles as he can tell her custom insoles felt much better.   Objective: AAO x3, NAD DP/PT pulses palpable bilaterally, CRT less than 3 seconds On the plantar aspect of left foot >> right foot there are multiple areas of calluses and old blister areas noted today.  Sharply debrided this without any complications there is no drainage or fluid identified today.  Concerning erythema or more from inflammation as opposed to infection.  There is no fluctuation or rotation.  There is no malodor. No pain with calf compression, swelling, warmth, erythema   Assessment: Skin lesions left side worse than right  Plan: -All treatment options discussed with the patient including all alternatives, risks, complications.  -Debrided the lesions without any complications or bleeding.  Will do 1 more round of antibiotics and refill doxycycline . -Discussed soaking his foot in Epsom salts soaks -Avoiding inserts with new top-cover -Monitor for any clinical signs or symptoms of infection and directed to call the office immediately should any occur or go to the ER.  Return for blisters in 2-3 weeks.  Charity Conch DPM

## 2024-03-19 ENCOUNTER — Telehealth: Payer: Self-pay | Admitting: Podiatry

## 2024-03-19 ENCOUNTER — Encounter: Payer: Self-pay | Admitting: Podiatry

## 2024-03-19 NOTE — Telephone Encounter (Signed)
 Pt checking on status or orthotics.

## 2024-03-21 ENCOUNTER — Ambulatory Visit: Payer: 59 | Admitting: Podiatry

## 2024-04-15 ENCOUNTER — Encounter: Payer: Self-pay | Admitting: Podiatry

## 2024-04-15 ENCOUNTER — Encounter (HOSPITAL_BASED_OUTPATIENT_CLINIC_OR_DEPARTMENT_OTHER): Payer: Self-pay | Admitting: Orthopaedic Surgery

## 2024-04-15 NOTE — Telephone Encounter (Signed)
 Kerney Pee or [place]- any update on his inserts?? Thanks!

## 2024-04-16 NOTE — Telephone Encounter (Signed)
 Yes pts inserts are here

## 2024-04-17 ENCOUNTER — Encounter (HOSPITAL_BASED_OUTPATIENT_CLINIC_OR_DEPARTMENT_OTHER): Admitting: Orthopaedic Surgery

## 2024-04-18 ENCOUNTER — Ambulatory Visit: Admitting: Podiatry

## 2024-04-29 ENCOUNTER — Ambulatory Visit: Admitting: Podiatry

## 2024-04-29 DIAGNOSIS — T148XXA Other injury of unspecified body region, initial encounter: Secondary | ICD-10-CM | POA: Diagnosis not present

## 2024-04-29 DIAGNOSIS — Z79899 Other long term (current) drug therapy: Secondary | ICD-10-CM

## 2024-04-29 DIAGNOSIS — B351 Tinea unguium: Secondary | ICD-10-CM

## 2024-04-29 MED ORDER — TERBINAFINE HCL 250 MG PO TABS: 250.0000 mg | ORAL_TABLET | Freq: Every day | ORAL | 0 refills | Status: AC

## 2024-04-29 NOTE — Patient Instructions (Addendum)
 Terbinafine  Tablets What is this medication? TERBINAFINE  (TER bin a feen) treats fungal infections of the nails. It belongs to a group of medications called antifungals. It will not treat infections caused by bacteria or viruses. This medicine may be used for other purposes; ask your health care provider or pharmacist if you have questions. COMMON BRAND NAME(S): Lamisil , Terbinex What should I tell my care team before I take this medication? They need to know if you have any of these conditions: Liver disease An unusual or allergic reaction to terbinafine , other medications, foods, dyes, or preservatives Pregnant or trying to get pregnant Breast-feeding How should I use this medication? Take this medication by mouth with water. Take it as directed on the prescription label at the same time every day. You can take it with or without food. If it upsets your stomach, take it with food. Keep taking it unless your care team tells you to stop. A special MedGuide will be given to you by the pharmacist with each prescription and refill. Be sure to read this information carefully each time. Talk to your care team about the use of this medication in children. Special care may be needed. Overdosage: If you think you have taken too much of this medicine contact a poison control center or emergency room at once. NOTE: This medicine is only for you. Do not share this medicine with others. What if I miss a dose? If you miss a dose, take it as soon as you can unless it is more than 4 hours late. If it is more than 4 hours late, skip the missed dose. Take the next dose at the normal time. What may interact with this medication? Do not take this medication with any of the following: Pimozide Thioridazine This medication may also interact with the following: Beta blockers Caffeine Certain medications for mental health conditions Cimetidine Cyclosporine Medications for fungal infections, such as fluconazole  or ketoconazole Medications for irregular heartbeat, such as amiodarone, flecainide, propafenone Rifampin Warfarin This list may not describe all possible interactions. Give your health care provider a list of all the medicines, herbs, non-prescription drugs, or dietary supplements you use. Also tell them if you smoke, drink alcohol, or use illegal drugs. Some items may interact with your medicine. What should I watch for while using this medication? Visit your care team for regular checks on your progress. Tell your care team if your symptoms do not start to get better or if they get worse. This medication may cause serious skin reactions. They can happen weeks to months after starting the medication. Contact your care team right away if you notice fevers or flu-like symptoms with a rash. The rash may be red or purple and then turn into blisters or peeling of the skin. You may also notice a red rash with swelling of the face, lips, or lymph nodes in your neck or under your arms. This medication can make you more sensitive to the sun. Keep out of the sun. If you cannot avoid being in the sun, wear protective clothing and sunscreen. Do not use sun lamps, tanning beds, or tanning booths. What side effects may I notice from receiving this medication? Side effects that you should report to your care team as soon as possible: Allergic reactions--skin rash, itching, hives, swelling of the face, lips, tongue, or throat Change in sense of smell Change in taste Infection--fever, chills, cough, or sore throat Liver injury--right upper belly pain, loss of appetite, nausea, light-colored stool, dark  yellow or brown urine, yellowing skin or eyes, unusual weakness or fatigue Low red blood cell level--unusual weakness or fatigue, dizziness, headache, trouble breathing Lupus-like syndrome--joint pain, swelling, or stiffness, butterfly-shaped rash on the face, rashes that get worse in the sun, fever, unusual  weakness or fatigue Rash, fever, and swollen lymph nodes Redness, blistering, peeling, or loosening of the skin, including inside the mouth Unusual bruising or bleeding Worsening mood, feelings of depression Side effects that usually do not require medical attention (report to your care team if they continue or are bothersome): Diarrhea Gas Headache Nausea Stomach pain Upset stomach This list may not describe all possible side effects. Call your doctor for medical advice about side effects. You may report side effects to FDA at 1-800-FDA-1088. Where should I keep my medication? Keep out of the reach of children and pets. Store between 20 and 25 degrees C (68 and 77 degrees F). Protect from light. Get rid of any unused medication after the expiration date. To get rid of medications that are no longer needed or have expired: Take the medication to a medication take-back program. Check with your pharmacy or law enforcement to find a location. If you cannot return the medication, check the label or package insert to see if the medication should be thrown out in the garbage or flushed down the toilet. If you are not sure, ask your care team. If it is safe to put it in the trash, take the medication out of the container. Mix the medication with cat litter, dirt, coffee grounds, or other unwanted substance. Seal the mixture in a bag or container. Put it in the trash. NOTE: This sheet is a summary. It may not cover all possible information. If you have questions about this medicine, talk to your doctor, pharmacist, or health care provider.  -- Plantar Fasciitis (Heel Spur Syndrome) with Rehab The plantar fascia is a fibrous, ligament-like, soft-tissue structure that spans the bottom of the foot. Plantar fasciitis is a condition that causes pain in the foot due to inflammation of the tissue. SYMPTOMS  Pain and tenderness on the underneath side of the foot. Pain that worsens with standing or  walking. CAUSES  Plantar fasciitis is caused by irritation and injury to the plantar fascia on the underneath side of the foot. Common mechanisms of injury include: Direct trauma to bottom of the foot. Damage to a small nerve that runs under the foot where the main fascia attaches to the heel bone. Stress placed on the plantar fascia due to bone spurs. RISK INCREASES WITH:  Activities that place stress on the plantar fascia (running, jumping, pivoting, or cutting). Poor strength and flexibility. Improperly fitted shoes. Tight calf muscles. Flat feet. Failure to warm-up properly before activity. Obesity. PREVENTION Warm up and stretch properly before activity. Allow for adequate recovery between workouts. Maintain physical fitness: Strength, flexibility, and endurance. Cardiovascular fitness. Maintain a health body weight. Avoid stress on the plantar fascia. Wear properly fitted shoes, including arch supports for individuals who have flat feet.  PROGNOSIS  If treated properly, then the symptoms of plantar fasciitis usually resolve without surgery. However, occasionally surgery is necessary.  RELATED COMPLICATIONS  Recurrent symptoms that may result in a chronic condition. Problems of the lower back that are caused by compensating for the injury, such as limping. Pain or weakness of the foot during push-off following surgery. Chronic inflammation, scarring, and partial or complete fascia tear, occurring more often from repeated injections.  TREATMENT  Treatment initially involves  the use of ice and medication to help reduce pain and inflammation. The use of strengthening and stretching exercises may help reduce pain with activity, especially stretches of the Achilles tendon. These exercises may be performed at home or with a therapist. Your caregiver may recommend that you use heel cups of arch supports to help reduce stress on the plantar fascia. Occasionally, corticosteroid  injections are given to reduce inflammation. If symptoms persist for greater than 6 months despite non-surgical (conservative), then surgery may be recommended.   MEDICATION  If pain medication is necessary, then nonsteroidal anti-inflammatory medications, such as aspirin  and ibuprofen , or other minor pain relievers, such as acetaminophen , are often recommended. Do not take pain medication within 7 days before surgery. Prescription pain relievers may be given if deemed necessary by your caregiver. Use only as directed and only as much as you need. Corticosteroid injections may be given by your caregiver. These injections should be reserved for the most serious cases, because they may only be given a certain number of times.  HEAT AND COLD Cold treatment (icing) relieves pain and reduces inflammation. Cold treatment should be applied for 10 to 15 minutes every 2 to 3 hours for inflammation and pain and immediately after any activity that aggravates your symptoms. Use ice packs or massage the area with a piece of ice (ice massage). Heat treatment may be used prior to performing the stretching and strengthening activities prescribed by your caregiver, physical therapist, or athletic trainer. Use a heat pack or soak the injury in warm water.  SEEK IMMEDIATE MEDICAL CARE IF: Treatment seems to offer no benefit, or the condition worsens. Any medications produce adverse side effects.  EXERCISES- RANGE OF MOTION (ROM) AND STRETCHING EXERCISES - Plantar Fasciitis (Heel Spur Syndrome) These exercises may help you when beginning to rehabilitate your injury. Your symptoms may resolve with or without further involvement from your physician, physical therapist or athletic trainer. While completing these exercises, remember:  Restoring tissue flexibility helps normal motion to return to the joints. This allows healthier, less painful movement and activity. An effective stretch should be held for at least 30  seconds. A stretch should never be painful. You should only feel a gentle lengthening or release in the stretched tissue.  RANGE OF MOTION - Toe Extension, Flexion Sit with your right / left leg crossed over your opposite knee. Grasp your toes and gently pull them back toward the top of your foot. You should feel a stretch on the bottom of your toes and/or foot. Hold this stretch for 10 seconds. Now, gently pull your toes toward the bottom of your foot. You should feel a stretch on the top of your toes and or foot. Hold this stretch for 10 seconds. Repeat  times. Complete this stretch 3 times per day.   RANGE OF MOTION - Ankle Dorsiflexion, Active Assisted Remove shoes and sit on a chair that is preferably not on a carpeted surface. Place right / left foot under knee. Extend your opposite leg for support. Keeping your heel down, slide your right / left foot back toward the chair until you feel a stretch at your ankle or calf. If you do not feel a stretch, slide your bottom forward to the edge of the chair, while still keeping your heel down. Hold this stretch for 10 seconds. Repeat 3 times. Complete this stretch 2 times per day.   STRETCH  Gastroc, Standing Place hands on wall. Extend right / left leg, keeping the front knee  somewhat bent. Slightly point your toes inward on your back foot. Keeping your right / left heel on the floor and your knee straight, shift your weight toward the wall, not allowing your back to arch. You should feel a gentle stretch in the right / left calf. Hold this position for 10 seconds. Repeat 3 times. Complete this stretch 2 times per day.  STRETCH  Soleus, Standing Place hands on wall. Extend right / left leg, keeping the other knee somewhat bent. Slightly point your toes inward on your back foot. Keep your right / left heel on the floor, bend your back knee, and slightly shift your weight over the back leg so that you feel a gentle stretch deep in your  back calf. Hold this position for 10 seconds. Repeat 3 times. Complete this stretch 2 times per day.  STRETCH  Gastrocsoleus, Standing  Note: This exercise can place a lot of stress on your foot and ankle. Please complete this exercise only if specifically instructed by your caregiver.  Place the ball of your right / left foot on a step, keeping your other foot firmly on the same step. Hold on to the wall or a rail for balance. Slowly lift your other foot, allowing your body weight to press your heel down over the edge of the step. You should feel a stretch in your right / left calf. Hold this position for 10 seconds. Repeat this exercise with a slight bend in your right / left knee. Repeat 3 times. Complete this stretch 2 times per day.   STRENGTHENING EXERCISES - Plantar Fasciitis (Heel Spur Syndrome)  These exercises may help you when beginning to rehabilitate your injury. They may resolve your symptoms with or without further involvement from your physician, physical therapist or athletic trainer. While completing these exercises, remember:  Muscles can gain both the endurance and the strength needed for everyday activities through controlled exercises. Complete these exercises as instructed by your physician, physical therapist or athletic trainer. Progress the resistance and repetitions only as guided.  STRENGTH - Towel Curls Sit in a chair positioned on a non-carpeted surface. Place your foot on a towel, keeping your heel on the floor. Pull the towel toward your heel by only curling your toes. Keep your heel on the floor. Repeat 3 times. Complete this exercise 2 times per day.  STRENGTH - Ankle Inversion Secure one end of a rubber exercise band/tubing to a fixed object (table, pole). Loop the other end around your foot just before your toes. Place your fists between your knees. This will focus your strengthening at your ankle. Slowly, pull your big toe up and in, making sure the  band/tubing is positioned to resist the entire motion. Hold this position for 10 seconds. Have your muscles resist the band/tubing as it slowly pulls your foot back to the starting position. Repeat 3 times. Complete this exercises 2 times per day.  Document Released: 10/31/2005 Document Revised: 01/23/2012 Document Reviewed: 02/12/2009 [facility] Patient Information 2014 [place], [place].    2024 Elsevier/Gold Standard (2023-05-19 00:00:00)

## 2024-04-30 ENCOUNTER — Ambulatory Visit: Payer: Self-pay | Admitting: Podiatry

## 2024-04-30 LAB — HEPATIC FUNCTION PANEL
ALT: 24 IU/L (ref 0–44)
AST: 22 IU/L (ref 0–40)
Albumin: 4.7 g/dL (ref 4.1–5.1)
Alkaline Phosphatase: 93 IU/L (ref 44–121)
Bilirubin Total: 0.5 mg/dL (ref 0.0–1.2)
Bilirubin, Direct: 0.16 mg/dL (ref 0.00–0.40)
Total Protein: 7 g/dL (ref 6.0–8.5)

## 2024-04-30 LAB — CBC WITH DIFFERENTIAL/PLATELET
Basophils Absolute: 0.1 10*3/uL (ref 0.0–0.2)
Basos: 1 %
EOS (ABSOLUTE): 0.1 10*3/uL (ref 0.0–0.4)
Eos: 1 %
Hematocrit: 48.9 % (ref 37.5–51.0)
Hemoglobin: 16.5 g/dL (ref 13.0–17.7)
Immature Grans (Abs): 0 10*3/uL (ref 0.0–0.1)
Immature Granulocytes: 0 %
Lymphocytes Absolute: 1.6 10*3/uL (ref 0.7–3.1)
Lymphs: 30 %
MCH: 30.6 pg (ref 26.6–33.0)
MCHC: 33.7 g/dL (ref 31.5–35.7)
MCV: 91 fL (ref 79–97)
Monocytes Absolute: 0.5 10*3/uL (ref 0.1–0.9)
Monocytes: 9 %
Neutrophils Absolute: 3.2 10*3/uL (ref 1.4–7.0)
Neutrophils: 59 %
Platelets: 286 10*3/uL (ref 150–450)
RBC: 5.39 x10E6/uL (ref 4.14–5.80)
RDW: 12.9 % (ref 11.6–15.4)
WBC: 5.4 10*3/uL (ref 3.4–10.8)

## 2024-05-01 NOTE — Progress Notes (Signed)
 Subjective: Chief Complaint  Patient presents with   BLISTERS    RM#11 Follow up on blisters on feet patient states doing extremely better has fungus issues on toe nail.     38 year old male presents the office today for follow evaluation of blisters to his feet.  He states these have subsided.  No open lesions or any blisters at this time.  Still when he has inserts.  A questions nail fungus.  Completed Lamisil  any side effects overall doing better but he still notices discoloration of the tip of the nailll h.  No swelling or redness or any drainage.  No open lesions.     Objective: AAO x3, NAD DP/PT pulses palpable bilaterally, CRT less than 3 seconds Nails appear to be growing out.  Some discoloration with yellow discoloration to distal aspect of toenails most of the hallux there is no edema, erythema or signs of infection.  There is clear proximal nail fold.  No edema, erythema or signs of action.  The blisters that were present on the foot have resolved.  No open lesions. No pain with calf compression, swelling, warmth, erythema   Assessment: Resolved blisters, skin lesions; onychomycosis  Plan: -All treatment options discussed with the patient including all alternatives, risks, complications.  -Blisters have resolved.  Monitor for any reoccurrence.  He was seen today by Kerney Pee, pedorthist for new inserts, top covers. -Will continue Lamisil  for 30 more days.  Refilled today.  Check CBC, LFT.  Monitor for any side effects.  Return if symptoms worsen or fail to improve.  Charity Conch DPM

## 2024-05-03 ENCOUNTER — Ambulatory Visit (HOSPITAL_BASED_OUTPATIENT_CLINIC_OR_DEPARTMENT_OTHER)

## 2024-05-03 ENCOUNTER — Ambulatory Visit (HOSPITAL_BASED_OUTPATIENT_CLINIC_OR_DEPARTMENT_OTHER): Admitting: Orthopaedic Surgery

## 2024-05-03 DIAGNOSIS — M1711 Unilateral primary osteoarthritis, right knee: Secondary | ICD-10-CM | POA: Diagnosis not present

## 2024-05-03 NOTE — Progress Notes (Signed)
 Post Operative Evaluation    Procedure/Date of Surgery: Right knee unicompartmental knee arthroplasty 11/19  Interval History:   Presents today status post right knee medial unicompartmental knee arthroplasty on the above date.  Overall he is doing well.  He has been working on his deck more recently and has been some pain with this although he is continuing to improve slowly.  PMH/PSH/Family History/Social History/Meds/Allergies:    Past Medical History:  Diagnosis Date   Narcolepsy    PONV (postoperative nausea and vomiting)    Past Surgical History:  Procedure Laterality Date   KNEE ARTHROSCOPY     x2   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   Procedure: RIGHT  KNEE UNI ARTHROPLASTY;  Surgeon: Wilhelmenia Harada, MD;  Location: [facility];  Service: Orthopedics;  Laterality: Right;   Social History   Socioeconomic History   Marital status: Married    Spouse name: Not on file   Number of children: Not on file   Years of education: Not on file   Highest education level: Not on file  Occupational History   Not on file  Tobacco Use   Smoking status: Never   Smokeless tobacco: Never  Vaping Use   Vaping status: Never Used  Substance and Sexual Activity   Alcohol use: Yes    Comment: occas   Drug use: No   Sexual activity: Not on file  Other Topics Concern   Not on file  Social History Narrative   Not on file   Social Drivers of Health   Financial Resource Strain: Not on file  Food Insecurity: Not on file  Transportation Needs: Not on file  Physical Activity: Not on file  Stress: Not on file  Social Connections: Not on file   No family history on file. No Known Allergies Current Outpatient Medications  Medication Sig Dispense Refill   amphetamine-dextroamphetamine (ADDERALL) 20 MG tablet Take 10 mg by mouth 2 (two) times daily.  0   aspirin  EC 325 MG tablet Take 1 tablet (325 mg total) by mouth daily. 30  tablet 0   ibuprofen  (ADVIL ) 800 MG tablet Take 1 tablet (800 mg total) by mouth every 8 (eight) hours as needed. 30 tablet 0   Multiple Vitamins-Minerals (MULTIVITAMIN WITH MINERALS) tablet Take 1 tablet by mouth daily.     oxyCODONE  (ROXICODONE ) 5 MG immediate release tablet Take 1 tablet (5 mg total) by mouth every 4 (four) hours as needed for severe pain (pain score 7-10) or breakthrough pain. (Patient not taking: Reported on 03/04/2024) 20 tablet 0   terbinafine  (LAMISIL ) 250 MG tablet Take 1 tablet (250 mg total) by mouth daily. 90 tablet 0   terbinafine  (LAMISIL ) 250 MG tablet Take 1 tablet (250 mg total) by mouth daily. 30 tablet 0   No current facility-administered medications for this visit.   No results found.  Review of Systems:   A ROS was performed including pertinent positives and negatives as documented in the HPI.   Musculoskeletal Exam:    There were no vitals taken for this visit.  Right knee incision is well-appearing without erythema or drainage.  Range of motion is 0 to 120 degrees.  This is no pain.  Mild quad atrophy.  Distal neurosensory exam is intact  Imaging:    4 views right knee:  Status post unicompartmental medial knee arthroplasty without complication  I personally reviewed and interpreted the radiographs.   Assessment:   Status post right knee medial unicompartmental knee arthroplasty.  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  Overall he is quite well compensated.  I will plan to see him back in 5 months for reassessment  Plan :    -Return to clinic 5 months for reassessment     I personally saw and evaluated the patient, and participated in the management and treatment plan.  Wilhelmenia Harada, MD Attending Physician, Orthopedic Surgery  This document was dictated using Dragon voice recognition software. A reasonable attempt at proof reading has been made to minimize errors.

## 2024-05-15 ENCOUNTER — Encounter (HOSPITAL_BASED_OUTPATIENT_CLINIC_OR_DEPARTMENT_OTHER): Admitting: Orthopaedic Surgery

## 2024-05-16 ENCOUNTER — Ambulatory Visit: Admitting: Podiatry

## 2024-05-26 ENCOUNTER — Other Ambulatory Visit: Payer: Self-pay | Admitting: Podiatry

## 2024-08-29 ENCOUNTER — Encounter: Payer: Self-pay | Admitting: Podiatry

## 2024-09-16 ENCOUNTER — Encounter: Payer: Self-pay | Admitting: Radiology

## 2024-10-09 ENCOUNTER — Ambulatory Visit (INDEPENDENT_AMBULATORY_CARE_PROVIDER_SITE_OTHER)

## 2024-10-09 ENCOUNTER — Ambulatory Visit (HOSPITAL_BASED_OUTPATIENT_CLINIC_OR_DEPARTMENT_OTHER): Admitting: Orthopaedic Surgery

## 2024-10-09 DIAGNOSIS — M1711 Unilateral primary osteoarthritis, right knee: Secondary | ICD-10-CM | POA: Diagnosis not present

## 2024-10-09 NOTE — Progress Notes (Signed)
 Post Operative Evaluation    Procedure/Date of Surgery: Right knee unicompartmental knee arthroplasty 11/19  Interval History:   Presents today status post right knee medial unicompartmental knee arthroplasty on the above date.  At this time he is still occasionally experiencing some swelling although that being said he still does consider dramatically improved from his preoperative state  PMH/PSH/Family History/Social History/Meds/Allergies:    Past Medical History:  Diagnosis Date   Narcolepsy    PONV (postoperative nausea and vomiting)    Past Surgical History:  Procedure Laterality Date   KNEE ARTHROSCOPY     x2   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   Procedure: RIGHT  KNEE UNI ARTHROPLASTY;  Surgeon: Genelle Standing, MD;  Location: [facility];  Service: Orthopedics;  Laterality: Right;   Social History   Socioeconomic History   Marital status: Married    Spouse name: Not on file   Number of children: Not on file   Years of education: Not on file   Highest education level: Not on file  Occupational History   Not on file  Tobacco Use   Smoking status: Never   Smokeless tobacco: Never  Vaping Use   Vaping status: Never Used  Substance and Sexual Activity   Alcohol use: Yes    Comment: occas   Drug use: No   Sexual activity: Not on file  Other Topics Concern   Not on file  Social History Narrative   Not on file   Social Drivers of Health   Financial Resource Strain: Not on file  Food Insecurity: Not on file  Transportation Needs: Not on file  Physical Activity: Not on file  Stress: Not on file  Social Connections: Not on file   No family history on file. No Known Allergies Current Outpatient Medications  Medication Sig Dispense Refill   amphetamine-dextroamphetamine (ADDERALL) 20 MG tablet Take 10 mg by mouth 2 (two) times daily.  0   aspirin  EC 325 MG tablet Take 1 tablet (325 mg total) by mouth  daily. 30 tablet 0   ibuprofen  (ADVIL ) 800 MG tablet Take 1 tablet (800 mg total) by mouth every 8 (eight) hours as needed. 30 tablet 0   Multiple Vitamins-Minerals (MULTIVITAMIN WITH MINERALS) tablet Take 1 tablet by mouth daily.     oxyCODONE  (ROXICODONE ) 5 MG immediate release tablet Take 1 tablet (5 mg total) by mouth every 4 (four) hours as needed for severe pain (pain score 7-10) or breakthrough pain. (Patient not taking: Reported on 03/04/2024) 20 tablet 0   terbinafine  (LAMISIL ) 250 MG tablet Take 1 tablet (250 mg total) by mouth daily. 90 tablet 0   terbinafine  (LAMISIL ) 250 MG tablet Take 1 tablet (250 mg total) by mouth daily. 30 tablet 0   No current facility-administered medications for this visit.   No results found.  Review of Systems:   A ROS was performed including pertinent positives and negatives as documented in the HPI.   Musculoskeletal Exam:    There were no vitals taken for this visit.  Right knee incision is well-appearing without erythema or drainage.  Range of motion is 0 to 120 degrees.  This is no pain.  Mild quad atrophy.  Distal neurosensory exam is intact  Imaging:    4 views right knee: Status post unicompartmental medial knee  arthroplasty without complication  I personally reviewed and interpreted the radiographs.   Assessment:   Status post right knee medial unicompartmental knee arthroplasty.  I did discuss that his x-rays today are stable.  He is occasionally having some swelling which may be related from arthritic changes in the remaining portions of the joint.  That being said he has overall improved from his preoperative baseline which I am grateful for.  I did discuss that we could perform Toradol injections as needed but that being said I would recommend that he get ahead of swelling episodes with routine of anti-inflammatory for 2 weeks at a time.  I will plan to see him as needed for these injections  Plan :    -Return to clinic as  needed     I personally saw and evaluated the patient, and participated in the management and treatment plan.  Elspeth Parker, MD Attending Physician, Orthopedic Surgery  This document was dictated using Dragon voice recognition software. A reasonable attempt at proof reading has been made to minimize errors.
